# Patient Record
Sex: Male | Born: 1985 | Hispanic: Yes | Marital: Married | Smoking: Former smoker
Health system: Southern US, Community
[De-identification: ages and names within clinical notes are randomized; demographics above are authoritative.]

## PROBLEM LIST (undated history)

## (undated) DIAGNOSIS — J302 Other seasonal allergic rhinitis: Secondary | ICD-10-CM

## (undated) DIAGNOSIS — T7840XA Allergy, unspecified, initial encounter: Secondary | ICD-10-CM

## (undated) DIAGNOSIS — A048 Other specified bacterial intestinal infections: Secondary | ICD-10-CM

## (undated) DIAGNOSIS — K219 Gastro-esophageal reflux disease without esophagitis: Secondary | ICD-10-CM

## (undated) HISTORY — DX: Other specified bacterial intestinal infections: A04.8

## (undated) HISTORY — DX: Allergy, unspecified, initial encounter: T78.40XA

## (undated) HISTORY — DX: Gastro-esophageal reflux disease without esophagitis: K21.9

## (undated) HISTORY — DX: Other seasonal allergic rhinitis: J30.2

## (undated) HISTORY — PX: OTHER SURGICAL HISTORY: SHX169

---

## 2008-06-20 ENCOUNTER — Emergency Department (HOSPITAL_COMMUNITY): Admission: EM | Admit: 2008-06-20 | Discharge: 2008-06-20 | Payer: Self-pay | Admitting: Emergency Medicine

## 2009-06-19 ENCOUNTER — Emergency Department (HOSPITAL_COMMUNITY): Admission: EM | Admit: 2009-06-19 | Discharge: 2009-06-19 | Payer: Self-pay | Admitting: Emergency Medicine

## 2011-04-03 LAB — BASIC METABOLIC PANEL
BUN: 9 mg/dL (ref 6–23)
CO2: 29 mEq/L (ref 19–32)
Calcium: 9.3 mg/dL (ref 8.4–10.5)
Chloride: 107 mEq/L (ref 96–112)
Creatinine, Ser: 0.9 mg/dL (ref 0.4–1.5)
GFR calc Af Amer: >60 mL/min (ref >60)

## 2011-04-03 LAB — CBC
MCHC: 33.3 g/dL (ref 30.0–36.0)
MCV: 91.4 fL (ref 78.0–100.0)
Platelets: 184 10*3/uL (ref 150–400)
RDW: 12.9 % (ref 11.5–15.5)

## 2011-04-03 LAB — URINALYSIS, ROUTINE W REFLEX MICROSCOPIC
Glucose, UA: NEGATIVE mg/dL
Hgb urine dipstick: NEGATIVE
Ketones, ur: NEGATIVE mg/dL
Protein, ur: NEGATIVE mg/dL
Urobilinogen, UA: 0.2 mg/dL (ref 0.0–1.0)

## 2011-09-21 LAB — URINALYSIS, ROUTINE W REFLEX MICROSCOPIC
Glucose, UA: NEGATIVE
Ketones, ur: 40 — AB
Nitrite: NEGATIVE
Protein, ur: NEGATIVE
Urobilinogen, UA: 1

## 2011-09-21 LAB — POCT I-STAT, CHEM 8
BUN: 11
Hemoglobin: 16
Potassium: 3.6
Sodium: 138
TCO2: 24

## 2011-09-21 LAB — DIFFERENTIAL
Basophils Absolute: 0
Eosinophils Relative: 2
Lymphocytes Relative: 26
Neutrophils Relative %: 64

## 2011-09-21 LAB — CBC
HCT: 43.8
Platelets: 168
RDW: 12.8
WBC: 10.5

## 2012-04-08 ENCOUNTER — Emergency Department (INDEPENDENT_AMBULATORY_CARE_PROVIDER_SITE_OTHER)
Admission: EM | Admit: 2012-04-08 | Discharge: 2012-04-08 | Disposition: A | Discharge status: Home / Self Care | Payer: Self-pay | Source: Home / Self Care | Attending: Emergency Medicine | Admitting: Emergency Medicine

## 2012-04-08 ENCOUNTER — Encounter (HOSPITAL_COMMUNITY): Payer: Self-pay

## 2012-04-08 DIAGNOSIS — J019 Acute sinusitis, unspecified: Secondary | ICD-10-CM

## 2012-04-08 DIAGNOSIS — J309 Allergic rhinitis, unspecified: Secondary | ICD-10-CM

## 2012-04-08 DIAGNOSIS — K118 Other diseases of salivary glands: Secondary | ICD-10-CM

## 2012-04-08 MED ORDER — FLUTICASONE PROPIONATE 50 MCG/ACT NA SUSP: 2.0000 | Freq: Every day | NASAL | Status: DC

## 2012-04-08 MED ORDER — AMOXICILLIN 500 MG PO CAPS: 1000.0000 mg | ORAL_CAPSULE | Freq: Three times a day (TID) | ORAL | Status: AC

## 2012-04-08 MED ORDER — METHYLPREDNISOLONE ACETATE 80 MG/ML IJ SUSP
80.0000 mg | Freq: Once | INTRAMUSCULAR | Status: AC
  Administered 2012-04-08: 80 mg via INTRAMUSCULAR

## 2012-04-08 MED ORDER — PREDNISONE 5 MG PO KIT: 1.0000 | PACK | Freq: Every day | ORAL | Status: DC

## 2012-04-08 MED ORDER — MONTELUKAST SODIUM 10 MG PO TABS: 10.0000 mg | ORAL_TABLET | Freq: Every day | ORAL | Status: DC

## 2012-04-08 MED ORDER — METHYLPREDNISOLONE ACETATE 80 MG/ML IJ SUSP
INTRAMUSCULAR | Status: AC
  Filled 2012-04-08: qty 1

## 2012-04-08 NOTE — ED Notes (Signed)
C/o pain facial area w nasal drainage, nasal congestion (green and yellow secretions) for 9+ months , pain in upper sinus area, eyes; NAD at present

## 2012-04-08 NOTE — ED Provider Notes (Signed)
Chief Complaint  Patient presents with  . Sinusitis    History of Present Illness:   The patient is a 26 year old male who states him and wished, but the history was obtained with the help of a telephone interpreter. He relates that 8-9 month history of nasal congestion with clear drainage, pressure and pain in his maxillary areas, headache, sneezing, itchy nose, and itchy, watery eyes. He notes diminished hearing in his left ear. He also has a bump just in front of his left ear which has been present for the past one to 2 years and has not been enlarging in size. This is not tender. He is taking over-the-counter allergy meds for this without much relief. He also uses daily nasal spray. He's had a slight cough but no wheezing or shortness of breath. He denies any fever, chills, or sore throat.  Review of Systems:  Other than noted above, the patient denies any of the following symptoms. Systemic:  No fever, chills, sweats, fatigue, myalgias, headache, or anorexia. Eye:  No redness, pain or drainage. ENT:  No earache, nasal congestion, rhinorrhea, sinus pressure, or sore throat. Lungs:  No cough, sputum production, wheezing, shortness of breath. Or chest pain. GI:  No nausea, vomiting, abdominal pain or diarrhea. Skin:  No rash or itching.  PMFSH:  Past medical history, family history, social history, meds, and allergies were reviewed.  Physical Exam:   Vital signs:  BP 95/60  Pulse 78  Temp(Src) 98.1 F (36.7 C) (Oral)  Resp 16  SpO2 99% General:  Alert, in no distress. Eye:  No conjunctival injection or drainage. ENT:  TMs and canals were normal, without erythema or inflammation.  Nasal mucosa was congested, pale, and boggy with some yellowish drainage on the left.  Mucous membranes were moist.  Pharynx was clear, without exudate or drainage.  There were no oral ulcerations or lesions. he also has a 1 cm nodule just in front of his left ear which was nontender. This was freely movable. It  may be a lymph node or could possibly be a parotid gland tumor. Neck:  Supple, no adenopathy, tenderness or mass. Lungs:  No respiratory distress.  Lungs were clear to auscultation, without wheezes, rales or rhonchi.  Breath sounds were clear and equal bilaterally. Heart:  Regular rhythm, without gallops, murmers or rubs. Skin:  Clear, warm, and dry, without rash or lesions.  Assessment:  The primary encounter diagnosis was Allergic rhinitis. Diagnoses of Acute sinusitis and Parotid nodule were also pertinent to this visit. He needs followup both with an allergist and with an ENT doctor for the parotid nodule.   Plan:   1.  The following meds were prescribed:   New Prescriptions   AMOXICILLIN (AMOXIL) 500 MG CAPSULE    Take 2 capsules (1,000 mg total) by mouth 3 (three) times daily.   FLUTICASONE (FLONASE) 50 MCG/ACT NASAL SPRAY    Place 2 sprays into the nose daily.   MONTELUKAST (SINGULAIR) 10 MG TABLET    Take 1 tablet (10 mg total) by mouth at bedtime.   PREDNISONE 5 MG KIT    Take 1 kit (5 mg total) by mouth daily after breakfast. Prednisone 5 mg 6 day dosepack.  Take as directed.   2.  The patient was instructed in symptomatic care and handouts were given. 3.  The patient was told to return if becoming worse in any way, if no better in 3 or 4 days, and given some red flag symptoms that would  indicate earlier return.  Follow up:  The patient was told to follow up with Dr. Lazarus Salines for the parotid nodule, and with Dr. [place] Callas for the allergy symptoms if the medications were not helping.  The patient was told that the differential diagnosis includes cancer, and that it was of the utmost importance to followup with the specialist to whom he was referred.   Reuben Likes, MD 04/08/12 2221

## 2012-04-08 NOTE — Discharge Instructions (Signed)
Rinitis Alrgica (Allergic Rhinitis) La rinitis alrgica aparece cuando las membranas mucosas de la nariz reaccionan a los alrgenos. Los alrgenos son las partculas que estn en el aire y a las que el organismo responde cuando existe una reaccin alrgica. Esto hace que usted libere anticuerpos de alergia. A travs de una sucesin de procesos, finalmente se libera histamina (de ah el uso de antihistamnicos) en el torrente sanguneo. Aunque esto implica una proteccin para su organismo, es lo que le produce las molestias., como estornudos frecuentes, congestin, picazn y goteos de la nariz.  CAUSAS Los alergenos del polen pueden provenir del csped, rboles y hierbas. Esto produce la rinitis alrgica estacional, o "fiebre de heno". Otras alrgenos pueden ocasionar rinitis alrgica persistente (rinitis alrgica perenne) como aquellos que contienen los caros del polvo del hogar, el pelaje de las mascotas y las esporas del moho.  SNTOMAS  Congestin nasal.   Picazn y goteo de la nariz con estornudos y lagrimeo de los ojos.   Generalmente, tambin puede haber picazn de la boca, ojos y odos.  Las alergias no pueden curarse pero pueden controlarse con medicamentos. DIAGNSTICO Si no reconoce exactamente cul es el alrgeno que le ocasiona el problema, podrn realizarle pruebas de sangre, o de piel para determinarlo. TRATAMIENTO  Evite el alrgeno.   Podrn ser tiles medicamentos y vacunas para la alergia (inmunoterapia).   Con frecuencia la fiebre de heno se trata simplemente con antihistamnicos en forma de pldoras o sprays nasales. Los antihistamnicos bloquean los efectos de la histamina. Existen medicamentos de venta libre que lo ayudarn a aliviar la picazn, la congestin nasal y la hinchazn alrededor de los ojos. Consulte con el profesional antes de tomar o administrar estos medicamentos.  Si estos medicamentos no le resultan efectivos, existen muchos otros nuevos que el  profesional que lo asiste puede prescribirle. Si las medidas iniciales no son efectivas, podrn utilizarse medicamentos ms fuertes. Las inyecciones desensibilizantes pueden utilizarse si los otros medicamentos fracasan. La desensibilizacin aparece cuando un paciente recibe inyecciones continuas hasta que el cuerpo se vuelve menos sensible al alrgeno. Asegrese de realizar un seguimiento con el profesional que lo asiste si los problemas continan. SOLICITE ANTENCIN MDICA SI:   Le sube la temperatura a ms de 100.5 F (38.1 C).   Presenta tos que no se alivia (persistente).   Le falta el aire.   Comienza a respirar con dificultad.   Los sntomas interfieren con las actividades diarias.  Document Released: 09/20/2005 Document Revised: 11/30/2011 [employer] Patient Information 2012 [employer]. 

## 2012-12-20 ENCOUNTER — Encounter: Payer: Self-pay | Admitting: Family Medicine

## 2012-12-20 ENCOUNTER — Ambulatory Visit (INDEPENDENT_AMBULATORY_CARE_PROVIDER_SITE_OTHER): Payer: Self-pay | Admitting: Family Medicine

## 2012-12-20 VITALS — BP 114/71 | HR 66 | Ht 67.0 in | Wt 172.0 lb

## 2012-12-20 DIAGNOSIS — J309 Allergic rhinitis, unspecified: Secondary | ICD-10-CM

## 2012-12-20 DIAGNOSIS — R002 Palpitations: Secondary | ICD-10-CM | POA: Insufficient documentation

## 2012-12-20 DIAGNOSIS — J302 Other seasonal allergic rhinitis: Secondary | ICD-10-CM

## 2012-12-20 MED ORDER — FLUTICASONE PROPIONATE 50 MCG/ACT NA SUSP: 2.0000 | Freq: Every day | NASAL | Status: DC

## 2012-12-20 NOTE — Progress Notes (Signed)
Interpreter Treniyah Lynn Namihira for Dr Cook 

## 2012-12-20 NOTE — Patient Instructions (Addendum)
Fue bueno verte hoy.Por favor disminuirlo lentamente la cafena, ya que est contribuyendo a su ritmo cardiaco rpido.Si contina teniendo problemas, por favor regrese a Glass blower/designer. Una vez que usted tiene un seguro que podemos considerar ms trabajar para arriba si usted sigue teniendo problemas.Tambin he rellenado su flonase.

## 2012-12-20 NOTE — Assessment & Plan Note (Signed)
Refilled Flonase today 

## 2012-12-20 NOTE — Progress Notes (Signed)
Subjective:     Patient ID: Albert Welch, male   DOB: 1986/08/26, 26 y.o.   MRN: [id number]  HPI 26 year old gentlemen presents to clinic to establish care.   Recently, he has been experiencing heart palpitations.  Interpreter present.  1) Heart Palpitations - Patient states that he feels likely his heart is constantly racing.  However, he does not feel like his heart is racing currently. - No associated chest pain or SOB. 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 - He reports drinking lots of Coca-Cola and has also been drinking energy drinks  Review of Systems See HPI    Objective:   Physical Exam  Constitutional: He appears well-developed and well-nourished. No distress.  Cardiovascular: Normal rate, regular rhythm and intact distal pulses.  Exam reveals no gallop and no friction rub.   No murmur heard. Pulmonary/Chest: Effort normal and breath sounds normal. He has no wheezes. He has no rales. He exhibits no tenderness.  Musculoskeletal: He exhibits no edema.      Assessment:         Plan:

## 2012-12-20 NOTE — Assessment & Plan Note (Signed)
Likely secondary to large amounts of caffeine.  Patient advised to taper off caffeine (soft drinks). Given clinical picture and lack of insurance, I did not get EKG or lab work today.  If it continues to persist will get EKG, BMP and consider referral to cardiology for Holter monitor.

## 2013-01-03 ENCOUNTER — Telehealth: Payer: Self-pay | Admitting: *Deleted

## 2013-01-03 NOTE — Telephone Encounter (Signed)
Patient may take Zyrtec 1 tablet daily and use Nettipot for symptomatic relief.  Both can be obtained OTC.

## 2013-01-03 NOTE — Telephone Encounter (Signed)
Patient calls and  Albert Welch interpreter  spoke with him. He states Flonase is not helping. Continues to have nasal stuffiness. Will forward message to Dr. Adriana Simas.

## 2013-01-29 ENCOUNTER — Emergency Department (INDEPENDENT_AMBULATORY_CARE_PROVIDER_SITE_OTHER)
Admission: EM | Admit: 2013-01-29 | Discharge: 2013-01-29 | Disposition: A | Discharge status: Home / Self Care | Payer: Self-pay | Source: Home / Self Care | Attending: Emergency Medicine | Admitting: Emergency Medicine

## 2013-01-29 ENCOUNTER — Emergency Department (INDEPENDENT_AMBULATORY_CARE_PROVIDER_SITE_OTHER): Payer: Self-pay

## 2013-01-29 ENCOUNTER — Encounter (HOSPITAL_COMMUNITY): Payer: Self-pay | Admitting: *Deleted

## 2013-01-29 DIAGNOSIS — J45909 Unspecified asthma, uncomplicated: Secondary | ICD-10-CM

## 2013-01-29 MED ORDER — CETIRIZINE HCL 10 MG PO CAPS: 1.0000 | ORAL_CAPSULE | Freq: Every day | ORAL | Status: DC

## 2013-01-29 MED ORDER — ALBUTEROL SULFATE (5 MG/ML) 0.5% IN NEBU
5.0000 mg | INHALATION_SOLUTION | Freq: Once | RESPIRATORY_TRACT | Status: AC
  Administered 2013-01-29: 5 mg via RESPIRATORY_TRACT

## 2013-01-29 MED ORDER — ALBUTEROL SULFATE (5 MG/ML) 0.5% IN NEBU
INHALATION_SOLUTION | RESPIRATORY_TRACT | Status: AC
  Filled 2013-01-29: qty 1

## 2013-01-29 MED ORDER — ALBUTEROL SULFATE HFA 108 (90 BASE) MCG/ACT IN AERS: 1.0000 | INHALATION_SPRAY | Freq: Four times a day (QID) | RESPIRATORY_TRACT | Status: DC | PRN

## 2013-01-29 MED ORDER — PREDNISONE 10 MG PO TABS: 20.0000 mg | ORAL_TABLET | Freq: Every day | ORAL | Status: AC

## 2013-01-29 MED ORDER — ALBUTEROL SULFATE HFA 108 (90 BASE) MCG/ACT IN AERS: 1.0000 | INHALATION_SPRAY | Freq: Once | RESPIRATORY_TRACT | Status: DC

## 2013-01-29 NOTE — ED Provider Notes (Addendum)
CSN: [id number]  Arrival date & time 01/29/13  1423   First MD Initiated Contact with Patient 01/29/13 1424      Chief Complaint  Patient presents with  . Cough    (Consider location/radiation/quality/duration/timing/severity/associated sxs/prior treatment) HPI Comments: Patient presents urgent care, describing that for a few months at times he feels tightness and some wheezing and cough. 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 He last 2 weeks patient has perceive this tightness wheezing or shortness of breath more frequently to the point that he feels his not breathing very well. Denies any fevers congestion and body aches or headaches.  Patient is a 27 y.o. male presenting with cough. The history is provided by the patient.  Cough This is a new problem. The current episode started more than 1 week ago. The problem occurs constantly. The problem has been gradually worsening. The cough is non-productive. There has been no fever. Associated symptoms include shortness of breath. Pertinent negatives include no chest pain, no chills, no ear pain, no headaches, no rhinorrhea, no sore throat, no myalgias and no wheezing. The treatment provided no relief. His past medical history does not include bronchitis, emphysema or asthma.    History reviewed. No pertinent past medical history.  History reviewed. No pertinent past surgical history.  No family history on file.  History  Substance Use Topics  . Smoking status: Former Games developer  . Smokeless tobacco: Not on file  . Alcohol Use: Yes      Review of Systems  Constitutional: Negative for fever, chills, diaphoresis, activity change and fatigue.  HENT: Negative for ear pain, sore throat and rhinorrhea.   Respiratory: Positive for cough and shortness of breath. Negative for choking, chest tightness, wheezing and stridor.   Cardiovascular: Negative for chest pain.  Musculoskeletal: Negative for myalgias.  Neurological:  Negative for dizziness and headaches.    Allergies  Review of patient's allergies indicates no known allergies.  Home Medications   Current Outpatient Rx  Name  Route  Sig  Dispense  Refill  . ALBUTEROL SULFATE HFA 108 (90 BASE) MCG/ACT IN AERS   Inhalation   Inhale 1-2 puffs into the lungs every 6 (six) hours as needed for wheezing.   1 Inhaler   0   . CETIRIZINE HCL 10 MG PO CAPS   Oral   Take 1 capsule (10 mg total) by mouth daily. X 2 weeks   14 capsule   1   . PREDNISONE 10 MG PO TABS   Oral   Take 2 tablets (20 mg total) by mouth daily.   15 tablet   0     BP 139/81  Pulse 76  Temp 98.6 F (37 C)  Resp 18  SpO2 98%  Physical Exam  Nursing note and vitals reviewed. Constitutional: Vital signs are normal. He appears well-developed and well-nourished.  Non-toxic appearance. He does not have a sickly appearance. He does not appear ill. No distress.  HENT:  Head: Normocephalic.  Eyes: Conjunctivae normal are normal. No scleral icterus.  Neck: No JVD present.  Cardiovascular: Normal rate.  Exam reveals no gallop and no friction rub.   No murmur heard. Pulmonary/Chest: Effort normal. No respiratory distress. He has decreased breath sounds. He has wheezes. He has no rhonchi. He has no rales. He exhibits no tenderness.  Abdominal: Soft.  Lymphadenopathy:    He has no cervical adenopathy.  Skin: No rash noted. No erythema.    ED  Course  Procedures (including critical care time)  Labs Reviewed - No data to display Dg Chest 2 View  01/29/2013  *RADIOLOGY REPORT*  Clinical Data: Cough, congestion, shortness of breath  CHEST - 2 VIEW  Comparison: None.  Findings: No pneumonia or effusion is seen.  There is some peribronchial thickening which may indicate bronchitis. Mediastinal contours appear normal.  The heart is within normal limits in size.  No bony abnormality is seen.  IMPRESSION: No pneumonia.  Question bronchitis.   Original Report Authenticated By: Dwyane Dee, M.D.      1. Reactive airway disease with wheezing     Significant clinical improvement after albuterol nebulizer treatment.  MDM  Symptoms exam was most consistent with, and reactive airway disease most likely induced by chemicals. Her patient will be prescribed Wellbutrin on a course of prednisone and trust to Zyrtec for 2 weeks.   Jimmie Molly, MD 01/29/13 1714  Jimmie Molly, MD 01/29/13 [phone]

## 2013-01-29 NOTE — ED Notes (Signed)
Pt  Reports   Cough         And  Congested           Pt  Is  A  Albert Welch         And  Reports  Some tightness  In  His  Chest            He  Is  A  Former  Smoker             He  Is  Sitting upright on  Exam table  Perhaps  In  Mild  Distress

## 2013-03-28 ENCOUNTER — Encounter: Payer: Self-pay | Admitting: Family Medicine

## 2013-03-28 ENCOUNTER — Ambulatory Visit (HOSPITAL_COMMUNITY)
Admission: RE | Admit: 2013-03-28 | Discharge: 2013-03-28 | Disposition: A | Discharge status: Home / Self Care | Payer: Self-pay | Source: Ambulatory Visit | Attending: Family Medicine | Admitting: Family Medicine

## 2013-03-28 ENCOUNTER — Ambulatory Visit (INDEPENDENT_AMBULATORY_CARE_PROVIDER_SITE_OTHER): Payer: Self-pay | Admitting: Family Medicine

## 2013-03-28 VITALS — BP 119/69 | HR 66 | Wt 167.0 lb

## 2013-03-28 DIAGNOSIS — R079 Chest pain, unspecified: Secondary | ICD-10-CM | POA: Insufficient documentation

## 2013-03-28 MED ORDER — CITALOPRAM HYDROBROMIDE 20 MG PO TABS: 20.0000 mg | ORAL_TABLET | Freq: Every day | ORAL | Status: DC

## 2013-03-28 MED ORDER — OMEPRAZOLE 20 MG PO CPDR: 40.0000 mg | DELAYED_RELEASE_CAPSULE | Freq: Every day | ORAL | Status: DC

## 2013-03-28 NOTE — Assessment & Plan Note (Signed)
Given persistent symptoms, EKG obtained today.  I personally reviewed the EKG - NSR with no ST or T wave changes suggestive of ischemia. Unclear etiology of chest pain.  Does not appear cardiac in origin given age, lack of risk factors, and symptoms reported. Lungs were clear and patient has no history of pulmonary disease, so unlikely Asthma or COPD.  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  However, given reports of anxiousness/nervousness and preoccupation with symptoms this is likely secondary to anxiety. Additional, there could be a component of GERD or esophageal spasm given report of "air" from belly causing pain.  Will treat empirically for likely anxiety and GERD components and reassess at follow up in 4-6 weeks.

## 2013-03-28 NOTE — Patient Instructions (Addendum)
Es Tacy Dura todo probable de [place] y / o reflujo gastroesofgico Su dolor en el pecho. He prescrito 2 medicamentos para usted - uno para el [address] y otro para la ansiedad. Usted puede seguir ejerciendo como lo hara normalmente. Me gustara verte de nuevo en 4-6 semanas (el medicamento ansiedad puede tomar hasta 6 semanas para trabajar).   Ansiedad y crisis de [place] (Anxiety and Panic Attacks) El profesional que lo asiste le ha informado que usted padece ansiedad o crisis de [place]. Este trastorno puede presentarse de [employer]. La mayor parte de las veces las crisis aparecen de modo repentino y sin aviso. Se producen en cualquier momento del da, incluso durante el sueo, y en cualquier etapa de la vida. Pueden ser muy intensas e inexplicadas. Aunque un ataque de pnico puede ser muy atemorizante, no produce daos fsicos. Algunas veces se desconoce la causa de la ansiedad. La ansiedad es un mecanismo protector del organismo en su respuesta de lucha o escape. Muchas de estas situaciones de percepcin de peligro son en realidad situaciones no fsicas (como la ansiedad de perder el [place]). CAUSAS Las causas de la ansiedad o de un ataque de pnico pueden ser [place]. Los ataques de pnico pueden ocurrir en personas sanas en una serie de circunstancias. Es posible que haya una causa gentica de los ataques de pnico. Algunos medicamentos pueden provocar ansiedad como efecto secundario. SNTOMAS Algunas de las sensaciones ms comunes son:  Terror intenso.  Vahdos, desfallecimiento.  Golpes de fro y Airline pilot.  Temor a Estate manager/land agent.  Sentimiento de irrealidad.  Sudoracin.  Temblores.  Dolor en el pecho y latidos irregulares (palpitaciones).  Sensaciones de [employer] o sofocos.  Sentimiento de peligro inminente y de que la muerte est prxima.  Hormigueo en las extremidades que puede venir de la respiracin agitada.  Alteracin de la realidad (desrealizacin).  Sentirse separado de  uno mismo (despersonalizacin). Estos son los sntomas (problemas) ms comunes y pueden combinarse para presentar la crisis de [place].  DIAGNSTICO La evaluacin que realice el profesional depender del tipo de sntomas que est experimentando. El diagnstico de la ansiedad o de ataque de pnico se realiza cuando no se encuentra ninguna enfermedad fsica que pueda determinarse como la causa de los sntomas. TRATAMIENTO El tratamiento para prevenir la ansiedad y los ataques de pnico incluye:  Automotive engineer las circunstancias que causen ansiedad.  Reaseguro y relajacin.  Ejercicio regular.  Terapias de relajacin, como yoga.  Psicoterapia con un psiquiatra o terapeuta.  Evitar la cafena, el alcohol y las drogas [address]  Medicamentos de prescripcin. SOLICITE ATENCIN MDICA DE INMEDIATO SI:  Usted experimenta sntomas de ataque de pnico que son distintos a sus sntomas usuales.  Tiene cualquier sntoma que empeora o lo preocupa. Document Released: 12/11/2005 Document Revised: 03/04/2012 [facility] Patient Information 2013 [place], [place].

## 2013-03-28 NOTE — Progress Notes (Signed)
Subjective:     Patient ID: Albert Welch, male   DOB: Feb 14, 1986, 27 y.o.   MRN: [id number]  HPI Albert Welch presents today for follow up regarding chest pain. Phone interpreter used as patient prefers to speak spanish.  1) Chest pain - Patient seen previously with chest pain and palpitations.  Endorsed large amount of caffeine use and was told to cut back. - He continues to have chest discomfort. He describes it as a tightness.  Located in mid sternum. No associated SOB.  No radiation.  Not associated with exertion and comes on intermittently.  This has improved with cessation of Coca-Cola. - His description is difficult to understand.  He refers to it as "air" from his abdomen that goes to his chest.  - He states that he does not feel that this is from his heart.  He reports being given Albuterol in the past which has helped.   - Additionally, he reports that the sensation makes him anxious and nervous which exacerbates the problem.  Review of Systems Per HPI with the following additions - No reflex or heartburn reported.      Objective:   Physical Exam Filed Vitals:   03/28/13 1539  BP: 119/69  Pulse: 66    General: well appearing gentlemen in NAD. Heart: RRR. No murmurs, rubs, or gallops. Lungs: CTAB. Extremities: no edema appreciated.     Assessment:      Plan:

## 2013-05-22 ENCOUNTER — Encounter: Payer: Self-pay | Admitting: Family Medicine

## 2013-05-22 ENCOUNTER — Ambulatory Visit (INDEPENDENT_AMBULATORY_CARE_PROVIDER_SITE_OTHER): Payer: Self-pay | Admitting: Family Medicine

## 2013-05-22 VITALS — BP 102/53 | HR 60 | Temp 98.8°F | Wt 170.5 lb

## 2013-05-22 DIAGNOSIS — F411 Generalized anxiety disorder: Secondary | ICD-10-CM | POA: Insufficient documentation

## 2013-05-22 DIAGNOSIS — K5900 Constipation, unspecified: Secondary | ICD-10-CM | POA: Insufficient documentation

## 2013-05-22 DIAGNOSIS — K219 Gastro-esophageal reflux disease without esophagitis: Secondary | ICD-10-CM | POA: Insufficient documentation

## 2013-05-22 MED ORDER — POLYETHYLENE GLYCOL 3350 17 GM/SCOOP PO POWD: 17.0000 g | Freq: Every day | ORAL | Status: DC

## 2013-05-22 MED ORDER — OMEPRAZOLE 20 MG PO CPDR: 40.0000 mg | DELAYED_RELEASE_CAPSULE | Freq: Every day | ORAL | Status: DC

## 2013-05-22 MED ORDER — CITALOPRAM HYDROBROMIDE 20 MG PO TABS: 40.0000 mg | ORAL_TABLET | Freq: Every day | ORAL | Status: DC

## 2013-05-22 MED ORDER — FLUTICASONE PROPIONATE 50 MCG/ACT NA SUSP: 2.0000 | Freq: Every day | NASAL | Status: DC

## 2013-05-22 NOTE — Assessment & Plan Note (Signed)
Patient endorsed constipation during history today. Will treat with Miralax and reassess at follow up visit.

## 2013-05-22 NOTE — Assessment & Plan Note (Signed)
Patient's symptomatolgy appears secondary to anxiety (GAD vs. Panic disorder).  He has noted improvement with Celexa, but symptoms are still present.  Will increase Celexa to 40 mg and see patient back in ~6 weeks.   Patient may benefit/need additional anxiolytic if symptoms continue to persist. Additionally, if symptoms continue to persist will screen for co-existing depression.

## 2013-05-22 NOTE — Patient Instructions (Addendum)
Por favor tmese el Celexa 40 mg / da [employer] comprimidos al da). Usted puede tomar esta noche.   Por favor tome el omeprazol 40 mg / da [employer] comprimidos al da).   Utilice la Miralax da para el estreimiento. Es posible que se [place] con ms frecuencia si es necesario para Personnel officer una evacuacin intestinal normal, [employer].

## 2013-05-22 NOTE — Assessment & Plan Note (Signed)
Patient endorses frequent heartburn and reports improvement with Omeprazole. Will continue current therapy.

## 2013-05-22 NOTE — Progress Notes (Signed)
Subjective:     Patient ID: Albert Welch, male   DOB: 1986-11-25, 27 y.o.   MRN: [id number]  HPI Mr. Clabo presents today for follow up regarding chest pain/anxiety.  Interpreter present.  1) Chest pain/anxiety - Chest pain now resolved. - He reports improvement in his symptoms with Celexa and omeprazole. - Patient continues to have intermittent episodes where he feels like he is "going to die."  He describes these episodes as "squeezing a pipe" but denies any associated SOB.  He believes that the underlying cause is related to his stomach as he has noticed improvement with Omeprazole.  - Of note, he has difficult time describing his symptoms.  The interpreter had difficulty understanding his story as well.    Review of Systems Per HPI.  No SOB, chest pain.  He reports heartburn and constipation.  He denies any current stressors in his life.     Objective:   Physical Exam Filed Vitals:   05/22/13 1614  BP: 102/53  Pulse: 60  Temp: 98.8 F (37.1 C)   General: well appearing, NAD. Heart: RRR. No murmurs, rubs, or gallops. Lungs: CTAB. No rales, rhonchi, or wheezing. Abd: soft, nontender, nondistended. No organomegaly. Psych: normal mood and affect.       Assessment:     See Problem list    Plan:

## 2013-11-28 ENCOUNTER — Emergency Department (INDEPENDENT_AMBULATORY_CARE_PROVIDER_SITE_OTHER)
Admission: EM | Admit: 2013-11-28 | Discharge: 2013-11-28 | Disposition: A | Discharge status: Home / Self Care | Payer: Self-pay | Source: Home / Self Care | Attending: Emergency Medicine | Admitting: Emergency Medicine

## 2013-11-28 ENCOUNTER — Encounter (HOSPITAL_COMMUNITY): Payer: Self-pay | Admitting: Emergency Medicine

## 2013-11-28 ENCOUNTER — Emergency Department (INDEPENDENT_AMBULATORY_CARE_PROVIDER_SITE_OTHER): Payer: Self-pay

## 2013-11-28 DIAGNOSIS — K5900 Constipation, unspecified: Secondary | ICD-10-CM

## 2013-11-28 MED ORDER — POLYETHYLENE GLYCOL 3350 17 GM/SCOOP PO POWD: 17.0000 g | Freq: Every day | ORAL | Status: DC

## 2013-11-28 NOTE — ED Notes (Signed)
Discussed d/c instructions w patient, advised to go to ED if syx are not improved after using Rx

## 2013-11-28 NOTE — ED Provider Notes (Signed)
CSN: [id number]     Arrival date & time 11/28/13  1918 History   First MD Initiated Contact with Patient 11/28/13 2013     No chief complaint on file.  (Consider location/radiation/quality/duration/timing/severity/associated sxs/prior Treatment) Patient is a 27 y.o. male presenting with abdominal pain. No language interpreter was used.  Abdominal Pain This is a new problem. Episode onset: 5 days. The problem occurs constantly. Associated symptoms include abdominal pain. Nothing aggravates the symptoms. Nothing relieves the symptoms. He has tried nothing for the symptoms.   Pt reports he has a good appetite but feels like he is swollen.   Pt reports some straining to go to the bathroom and stomach grumbling.  No fever, no vomitting, no diarrhea, Past Medical History  Diagnosis Date  . Seasonal allergies    Past Surgical History  Procedure Laterality Date  . None     Family History  Problem Relation Age of Onset  . Diabetes Mother    History  Substance Use Topics  . Smoking status: Former Smoker    Quit date: 05/25/2010  . Smokeless tobacco: Not on file  . Alcohol Use: No    Review of Systems  Gastrointestinal: Positive for abdominal pain and constipation.  All other systems reviewed and are negative.    Allergies  Review of patient's allergies indicates no known allergies.  Home Medications   Current Outpatient Rx  Name  Route  Sig  Dispense  Refill  . citalopram (CELEXA) 20 MG tablet   Oral   Take 2 tablets (40 mg total) by mouth daily.   60 tablet   3   . fluticasone (FLONASE) 50 MCG/ACT nasal spray   Nasal   Place 2 sprays into the nose daily.   16 g   3   . omeprazole (PRILOSEC) 20 MG capsule   Oral   Take 2 capsules (40 mg total) by mouth daily.   60 capsule   3   . polyethylene glycol powder (GLYCOLAX/MIRALAX) powder   Oral   Take 17 g by mouth daily.   119 g   3    BP 122/73  Pulse 68  Temp(Src) 98.3 F (36.8 C) (Oral)  Resp 14  SpO2  100% Physical Exam  Nursing note and vitals reviewed. Constitutional: He appears well-developed and well-nourished.  HENT:  Head: Normocephalic.  Right Ear: External ear normal.  Left Ear: External ear normal.  Eyes: Pupils are equal, round, and reactive to light.  Neck: Normal range of motion.  Cardiovascular: Normal rate and normal heart sounds.   Pulmonary/Chest: Effort normal and breath sounds normal.  Abdominal: Soft.  Musculoskeletal: Normal range of motion.  Neurological: He is alert.  Skin: Skin is warm.  Psychiatric: He has a normal mood and affect.    ED Course  Procedures (including critical care time) Labs Review Labs Reviewed - No data to display Imaging Review No results found.  EKG Interpretation    Date/Time:    Ventricular Rate:    PR Interval:    QRS Duration:   QT Interval:    QTC Calculation:   R Axis:     Text Interpretation:              MDM   1. Constipation    miralax    Elson Areas, PA-C 11/28/13 2043

## 2013-11-28 NOTE — ED Notes (Signed)
C/o abdominal pain for 5 days.  Patient has a good appetite, but feels pain after eating.  No nausea, no vomiting, no diarrhea.  No urinary symptoms

## 2013-12-01 NOTE — ED Provider Notes (Signed)
Medical screening examination/treatment/procedure(s) were performed by a resident physician or non-physician practitioner and as the supervising physician I was immediately available for consultation/collaboration.  Clementeen Graham, MD    Rodolph Bong, MD 12/01/13 [phone]

## 2014-01-09 ENCOUNTER — Encounter: Payer: Self-pay | Admitting: Gastroenterology

## 2014-01-23 ENCOUNTER — Ambulatory Visit (INDEPENDENT_AMBULATORY_CARE_PROVIDER_SITE_OTHER): Payer: Self-pay | Admitting: Gastroenterology

## 2014-01-23 ENCOUNTER — Encounter: Payer: Self-pay | Admitting: Gastroenterology

## 2014-01-23 VITALS — BP 120/82 | HR 76 | Ht 67.5 in | Wt 166.2 lb

## 2014-01-23 DIAGNOSIS — F411 Generalized anxiety disorder: Secondary | ICD-10-CM

## 2014-01-23 DIAGNOSIS — K219 Gastro-esophageal reflux disease without esophagitis: Secondary | ICD-10-CM

## 2014-01-23 MED ORDER — ALPRAZOLAM 0.5 MG PO TABS: ORAL_TABLET | ORAL | Status: DC

## 2014-01-23 NOTE — Assessment & Plan Note (Signed)
Patient continues to have a soft or reflux despite having taken omeprazole.  Anxiety may be contributing to his symptoms.  He currently is off all medications.  Other considerations include ulcer or nonulcer dyspepsia and H. pylori infection  Recommendations #1 trial of dexilant 60 mg before breakfast #2 upper endoscopy

## 2014-01-23 NOTE — Progress Notes (Signed)
    _                                                                                                                History of Present Illness: History obtained through a Nurse, learning disabilitytranslator.  28 year old Hispanic male referred for evaluation of abdominal pain.  Despite taking PPI therapy he's been suffering from very frequent pyrosis, burning and abdominal discomfort and early satiety.  He is on no regular gastric irritants including nonsteroidals.  He denies dysphagia.  He was suffering from constipation but now is moving his bowels more regularly.  Since stopping smoking he's had what sounds like episodes of anxiety with periods of shortness of breath and nervousness.  There is no history of melena or hematochezia.    Past Medical History  Diagnosis Date  . Seasonal allergies   . GERD (gastroesophageal reflux disease)    Past Surgical History  Procedure Laterality Date  . None     family history includes Diabetes in his mother; Kidney disease in his mother. There is no history of Colon cancer or Esophageal cancer. No current outpatient prescriptions on file.   No current facility-administered medications for this visit.   Allergies as of 01/23/2014  . (No Known Allergies)    reports that he quit smoking about 3 years ago. He has never used smokeless tobacco. He reports that he drinks alcohol. He reports that he does not use illicit drugs.     Review of Systems: Pertinent positive and negative review of systems were noted in the above HPI section. All other review of systems were otherwise negative.  Vital signs were reviewed in today's medical record Physical Exam: General: Well developed , well nourished, no acute distress Skin: anicteric Head: Normocephalic and atraumatic Eyes:  sclerae anicteric, EOMI Ears: Normal auditory acuity Mouth: No deformity or lesions Neck: Supple, no masses or thyromegaly Lungs: Clear throughout to auscultation Heart: Regular rate and  rhythm; no murmurs, rubs or bruits Abdomen: Soft, non tender and non distended. No masses, hepatosplenomegaly or hernias noted. Normal Bowel sounds Rectal:deferred Musculoskeletal: Symmetrical with no gross deformities  Skin: No lesions on visible extremities Pulses:  Normal pulses noted Extremities: No clubbing, cyanosis, edema or deformities noted Neurological: Alert oriented x 4, grossly nonfocal Cervical Nodes:  No significant cervical adenopathy Inguinal Nodes: No significant inguinal adenopathy Psychological:  Alert and cooperative. Normal mood and affect  See Assessment and Plan under Problem List

## 2014-01-23 NOTE — Patient Instructions (Signed)
Dieta para el reflujo gastroesofágico - Adultos   (Diet for Gastroesophageal Reflux Disease, Adult)   El reflujo (reflujo ácido) ocurre cuando el ácido del estómago pasa al esófago. Cuando el ácido entra en contacto con el esófago, el ácido provoca dolor e irritación (inflamación) en el esófago. Cuando el reflujo ocurre a menudo o es tan grave que causa daño en el esófago, se denomina enfermedad por reflujo gastroesofágico (ERGE). La terapia nutricional puede ayudar a aliviar el malestar de la ERGE.   ALIMENTOS O BEBIDAS QUE DEBE EVITAR O LIMITAR   · Fumar o consumir tabaco. La nicotina es uno de los estimulantes más potentes en la producción de ácido en el tracto gastrointestinal.  · Café y té negro con cafeína o descafeinado.  · Gaseosas comunes o bajas calorías o bebidas energizantes (las gaseosas sin cafeína están permitidas).    · Especias picantes, como la pimienta negra, pimienta blanca, pimienta roja, pimienta de cayena, curry en polvo,y chile en polvo.  · Menta y mentol.  · Chocolate.  · Alimentos con alto contenido de grasas, incluyendo las carnes y comidas fritas. El agregado de grasas extra, por ejemplo aceite, manteca, aderezo para ensaladas y nueces. Limite estos alimentos a menos de 8 cucharaditas por día.  · Las frutas y verduras si no son toleradas, tales como frutas cítricas o tomates.  · El alcohol.  · Todo alimento que agrave el trastorno.  Si tiene dudas relacionadas con la dieta, comuníquese con el profesional que lo asiste o con un nutricionista matriculado.   OTROS FACTORES QUE PUEDEN ALIVIAR EL ERGE SON:   · Comer lentamente, en un clima distendido.  · Hacer 5 o 6 comidas pequeñas por día en vez de tres grandes.  · Suprimir por un tiempo los alimentos que causen problemas.  · No acostarse hasta después de 3 horas de haber comido.  · Mantener la cabeza elevada 6 a 9 pulgadas (15 a 23 cm) usando una cuña de espuma o bloques debajo de las patas de la cama. Si permanece en una postura plana hará  empeorar los síntomas.  · Manténgase físicamente activo. Perder peso puede ser de ayuda para reducir el reflujo en los adultos obesos o con sobrepeso.  · Use ropas sueltas.  EJEMPLO DE UN PLAN DE ALIMENTACIÓN   Este plan de alimentación consiste en aproximadamente 2 000 calorías, según las guías de alimentación de ChooseMyPlate.gov.   Desayuno  · ½ taza de avena cocida.  · 1 porción de fresas.  · 1 taza de leche descremada.  · 1 oz de almendras.  Colación  · 1 taza de rebanadas de pepino.  · 6 oz de yogur (elaborado con leche con bajo contenido de grasas o descremada).  Almuerzo:  · 2 rebanada de pan integral.  · 2½ oz de rebanadas de pavo.  · 2 cucharaditas de mayonesa.  · 1 taza de arándanos.  · 1 taza de guisantes.  Colación  · 6 crackers integrales.  · 1 oz ( 28 g) de queso en hebras.  Cena  · ½ taza de arroz integral.  · 1 taza de vegetales variados.  · 1 cucharadita de aceite de oliva.  · 3 oz ( 84 g) de pescado grillé.  Document Released: 09/20/2005 Document Revised: 03/04/2012  ExitCare® Patient Information ©2014 [employer], LLC.

## 2014-01-23 NOTE — Assessment & Plan Note (Signed)
Anxiety seems to be an active issue.  He is taking Celexa in the past.  He probably could benefit from a long acting anxiolytic.  I will refer him back to his PCP for this.  In the interim I will prescribe Xanax

## 2014-02-17 ENCOUNTER — Encounter: Payer: Self-pay | Admitting: Gastroenterology

## 2014-02-17 ENCOUNTER — Ambulatory Visit (AMBULATORY_SURGERY_CENTER): Payer: Self-pay | Admitting: Gastroenterology

## 2014-02-17 VITALS — BP 106/74 | HR 65 | Temp 97.8°F | Resp 44 | Wt 166.0 lb

## 2014-02-17 DIAGNOSIS — K2970 Gastritis, unspecified, without bleeding: Secondary | ICD-10-CM

## 2014-02-17 DIAGNOSIS — K219 Gastro-esophageal reflux disease without esophagitis: Secondary | ICD-10-CM

## 2014-02-17 DIAGNOSIS — K2990 Gastroduodenitis, unspecified, without bleeding: Secondary | ICD-10-CM

## 2014-02-17 DIAGNOSIS — A048 Other specified bacterial intestinal infections: Secondary | ICD-10-CM

## 2014-02-17 DIAGNOSIS — K2980 Duodenitis without bleeding: Secondary | ICD-10-CM

## 2014-02-17 MED ORDER — SODIUM CHLORIDE 0.9 % IV SOLN: 500.0000 mL | INTRAVENOUS | Status: DC

## 2014-02-17 NOTE — Progress Notes (Signed)
Called to room to assist during endoscopic procedure.  Patient ID and intended procedure confirmed with present staff. Received instructions for my participation in the procedure from the performing physician.  

## 2014-02-17 NOTE — Op Note (Signed)
 [facility] [address] [place] [id number]   ENDOSCOPY PROCEDURE REPORT  PATIENT: Albert Welch, Albert Welch  MR#: [id number] BIRTHDATE: Mar 16, 1986 , 28  yrs. old GENDER: Male ENDOSCOPIST: Louis Meckelobert D Nyree Applegate, MD REFERRED BY:  Sigmund HazelLisa Miller, M.D. PROCEDURE DATE:  02/17/2014 PROCEDURE:  EGD w/ biopsy ASA CLASS:     Class I INDICATIONS:  Epigastric pain.   Heartburn. MEDICATIONS: MAC sedation, administered by CRNA, propofol (Diprivan) 150mg  IV, and Simethicone 0.6cc PO TOPICAL ANESTHETIC: Cetacaine Spray  DESCRIPTION OF PROCEDURE: After the risks benefits and alternatives of the procedure were thoroughly explained, informed consent was obtained.  The LB [id number] endoscope was introduced through the mouth and advanced to the third portion of the duodenum. Without limitations.  The instrument was slowly withdrawn as the mucosa was fully examined.      In the gastric fundus and cardia, and in the duodenal bulb, there is erosive changes and areas of subepithelial hemorrhage.  Biopsies were taken of the stomach.  There was no fresh or old blood.   The remainder of the upper endoscopy exam was otherwise normal. Retroflexed views revealed no abnormalities.     The scope was then withdrawn from the patient and the procedure completed.  COMPLICATIONS: There were no complications. ENDOSCOPIC IMPRESSION: 1.   gastritis and duodenitis  RECOMMENDATIONS: 1.  Await pathology results 2.  Continue current meds 3.  Call office next 2-3 days to schedule an office appointment for 3-4 weeks  REPEAT EXAM:  eSigned:  Louis Meckelobert D June Rode, MD 02/17/2014 10:21 AM   CC: Everlene OtherJayce Cook, MD

## 2014-02-17 NOTE — Patient Instructions (Addendum)
YOU HAD AN ENDOSCOPIC PROCEDURE TODAY AT THE [facility]: Refer to the procedure report that was given to you for any specific questions about what was found during the examination.  If the procedure report does not answer your questions, please call your gastroenterologist to clarify.  If you requested that your care partner not be given the details of your procedure findings, then the procedure report has been included in a sealed envelope for you to review at your convenience later.  YOU SHOULD EXPECT: Some feelings of bloating in the abdomen. Passage of more gas than usual.  Walking can help get rid of the air that was put into your GI tract during the procedure and reduce the bloating. If you had a lower endoscopy (such as a colonoscopy or flexible sigmoidoscopy) you may notice spotting of blood in your stool or on the toilet paper. If you underwent a bowel prep for your procedure, then you may not have a normal bowel movement for a few days.  DIET: Your first meal following the procedure should be a light meal and then it is ok to progress to your normal diet.  A half-sandwich or bowl of soup is an example of a good first meal.  Heavy or fried foods are harder to digest and may make you feel nauseous or bloated.  Likewise meals heavy in dairy and vegetables can cause extra gas to form and this can also increase the bloating.  Drink plenty of fluids but you should avoid alcoholic beverages for 24 hours.  Do not have anything by mouth until the numbness is gone.  I don't want you to choke!  ACTIVITY: Your care partner should take you home directly after the procedure.  You should plan to take it easy, moving slowly for the rest of the day.  You can resume normal activity the day after the procedure however you should NOT DRIVE or use heavy machinery for 24 hours (because of the sedation medicines used during the test).    SYMPTOMS TO REPORT IMMEDIATELY: A gastroenterologist can be reached  at any hour.  During normal business hours, 8:30 AM to 5:00 PM Monday through Friday, call [phone].  After hours and on weekends, please call the GI answering service at [phone] who will take a message and have the physician on call contact you.   Following upper endoscopy (EGD)  Vomiting of blood or coffee ground material  New chest pain or pain under the shoulder blades  Painful or persistently difficult swallowing  New shortness of breath  Fever of 100F or higher  Black, tarry-looking stools  FOLLOW UP: If any biopsies were taken you will be contacted by phone or by letter within the next 1-3 weeks.  Call your gastroenterologist if you have not heard about the biopsies in 3 weeks.  Our staff will call the home number listed on your records the next business day following your procedure to check on you and address any questions or concerns that you may have at that time regarding the information given to you following your procedure. This is a courtesy call and so if there is no answer at the home number and we have not heard from you through the emergency physician on call, we will assume that you have returned to your regular daily activities without incident.  SIGNATURES/CONFIDENTIALITY: You and/or your care partner have signed paperwork which will be entered into your electronic medical record.  These signatures attest to the fact  that that the information above on your After Visit Summary has been reviewed and is understood.  Full responsibility of the confidentiality of this discharge information lies with you and/or your care-partner.  GERD information given as well as info about Gastritis.

## 2014-02-18 ENCOUNTER — Telehealth: Payer: Self-pay | Admitting: *Deleted

## 2014-02-18 NOTE — Telephone Encounter (Signed)
  Follow up Call-  Call back number 02/17/2014  Post procedure Call Back phone  # [phone]  Permission to leave phone message Yes     Patient questions:  Do you have a fever, pain , or abdominal swelling? no Pain Score  0 *  Have you tolerated food without any problems? yes  Have you been able to return to your normal activities? yes  Do you have any questions about your discharge instructions: Diet   no Medications  no Follow up visit  no  Do you have questions or concerns about your Care? no  Actions: * If pain score is 4 or above: No action needed, pain <4.

## 2014-02-26 ENCOUNTER — Telehealth: Payer: Self-pay | Admitting: *Deleted

## 2014-02-26 ENCOUNTER — Encounter: Payer: Self-pay | Admitting: Gastroenterology

## 2014-02-26 DIAGNOSIS — R1013 Epigastric pain: Secondary | ICD-10-CM

## 2014-02-26 MED ORDER — AMOXICILL-CLARITHRO-LANSOPRAZ PO MISC: Freq: Two times a day (BID) | ORAL | Status: DC

## 2014-02-26 NOTE — Telephone Encounter (Signed)
Pt states he doesn't speak much  [place].  I explained that [facility] [place], follow those instructions and keep his appt that is set up with Dr. Arlyce DiceKaplan

## 2014-02-26 NOTE — Telephone Encounter (Signed)
Not able to leave a message in voicemail box- not set up yet.  I sent his RX to his pharmacy- [facility], and will try pt again

## 2014-03-13 ENCOUNTER — Encounter: Payer: Self-pay | Admitting: Gastroenterology

## 2014-03-13 ENCOUNTER — Ambulatory Visit (INDEPENDENT_AMBULATORY_CARE_PROVIDER_SITE_OTHER): Payer: Self-pay | Admitting: Gastroenterology

## 2014-03-13 VITALS — BP 110/70 | HR 72 | Ht 67.5 in | Wt 161.0 lb

## 2014-03-13 DIAGNOSIS — K219 Gastro-esophageal reflux disease without esophagitis: Secondary | ICD-10-CM

## 2014-03-13 DIAGNOSIS — F411 Generalized anxiety disorder: Secondary | ICD-10-CM

## 2014-03-13 MED ORDER — ALPRAZOLAM 0.5 MG PO TABS: ORAL_TABLET | ORAL | Status: DC

## 2014-03-13 MED ORDER — LANSOPRAZOLE 30 MG PO CPDR: 30.0000 mg | DELAYED_RELEASE_CAPSULE | Freq: Every day | ORAL | Status: DC

## 2014-03-13 NOTE — Assessment & Plan Note (Signed)
Patient has GERD and dyspepsia.  I suspect that anxiety is also a contributing factor.  He was recently treated for H. pylori.    Recommendations #1 restart Prevacid 30 mg daily

## 2014-03-13 NOTE — Patient Instructions (Signed)
We have given you a printed prescription of Xanax today

## 2014-03-13 NOTE — Progress Notes (Signed)
          History of Present Illness:  The patient has returned following upper endoscopy which demonstrated a gastroduodenitis.  He was H. pylori positive and was treated with a Prevpac.  Symptoms resolve but over the past week or so he's having recurrent mild discomfort and a "emptying feeling" similar to what he had previously.  He is on no medications.  Sutures were also better when he took Xanax.    Review of Systems: Pertinent positive and negative review of systems were noted in the above HPI section. All other review of systems were otherwise negative.    Current Medications, Allergies, Past Medical History, Past Surgical History, Family History and Social History were reviewed in [facility] electronic [facility]  Vital signs were reviewed in today's medical record. Physical Exam: General: Well developed , well nourished, no acute distress   See Assessment and Plan under Problem List

## 2014-07-20 ENCOUNTER — Telehealth: Payer: Self-pay | Admitting: Gastroenterology

## 2014-07-20 NOTE — Telephone Encounter (Signed)
Call placed to home. Wife answered but states they will call back later because she does not speak english well.

## 2014-07-30 ENCOUNTER — Ambulatory Visit: Payer: Self-pay | Admitting: Internal Medicine

## 2014-09-28 ENCOUNTER — Emergency Department (HOSPITAL_COMMUNITY)
Admission: EM | Admit: 2014-09-28 | Discharge: 2014-09-28 | Disposition: A | Discharge status: Home / Self Care | Payer: Self-pay | Source: Home / Self Care | Attending: Family Medicine | Admitting: Family Medicine

## 2014-09-28 ENCOUNTER — Encounter (HOSPITAL_COMMUNITY): Payer: Self-pay | Admitting: Emergency Medicine

## 2014-09-28 DIAGNOSIS — H00016 Hordeolum externum left eye, unspecified eyelid: Secondary | ICD-10-CM

## 2014-09-28 MED ORDER — TETRACAINE HCL 0.5 % OP SOLN
OPHTHALMIC | Status: AC
  Filled 2014-09-28: qty 2

## 2014-09-28 MED ORDER — POLYMYXIN B-TRIMETHOPRIM 10000-0.1 UNIT/ML-% OP SOLN: 2.0000 [drp] | OPHTHALMIC | Status: DC

## 2014-09-28 NOTE — ED Provider Notes (Signed)
CSN: [id number]     Arrival date & time 09/28/14  1049 History   First MD Initiated Contact with Patient 09/28/14 1129     Chief Complaint  Patient presents with  . Stye   (Consider location/radiation/quality/duration/timing/severity/associated sxs/prior Treatment) HPI Comments: Left lower eyelid stye x 4 days. Reports some improvement with warm compresses yesterday. No fever/chills or changes in vision. Area is minimally tender. No contact lens use. Otherwise healthy. Denies pain Works as Education administratorpainter.   The history is provided by the patient.    Past Medical History  Diagnosis Date  . Seasonal allergies   . GERD (gastroesophageal reflux disease)   . H. pylori infection    Past Surgical History  Procedure Laterality Date  . None     Family History  Problem Relation Age of Onset  . Diabetes Mother   . Kidney disease Mother     as a child  . Colon cancer Neg Hx   . Esophageal cancer Neg Hx    History  Substance Use Topics  . Smoking status: Former Smoker    Quit date: 05/25/2010  . Smokeless tobacco: Never Used  . Alcohol Use: Yes     Comment: rare    Review of Systems  All other systems reviewed and are negative.   Allergies  Review of patient's allergies indicates no known allergies.  Home Medications   Prior to Admission medications   Medication Sig Start Date End Date Taking? Authorizing Provider  ALPRAZolam Prudy Feeler(XANAX) 0.5 MG tablet Take one tab every morning, then every 8 hours as needed 03/13/14   Louis Meckelobert D Kaplan, MD  lansoprazole (PREVACID) 30 MG capsule Take 1 capsule (30 mg total) by mouth daily at 12 noon. 03/13/14   Louis Meckelobert D Kaplan, MD  trimethoprim-polymyxin b (POLYTRIM) ophthalmic solution Place 2 drops into the left eye every 4 (four) hours. X 7 days 09/28/14   Jess BartersJennifer Lee H Jenne Sellinger, PA   BP 115/61  Pulse 67  Temp(Src) 98.8 F (37.1 C) (Oral)  SpO2 100% Physical Exam  Nursing note and vitals reviewed. Constitutional: He is oriented to person, place,  and time. He appears well-developed and well-nourished. No distress.  HENT:  Head: Normocephalic and atraumatic.  Eyes: Conjunctivae and EOM are normal. Pupils are equal, round, and reactive to light. Right eye exhibits chemosis. Right eye exhibits no discharge, no exudate and no hordeolum. No foreign body present in the right eye. Left eye exhibits hordeolum. Left eye exhibits no chemosis, no discharge and no exudate. No foreign body present in the left eye. No scleral icterus.    Cardiovascular: Normal rate.   Pulmonary/Chest: Effort normal.  Musculoskeletal: Normal range of motion.  Neurological: He is alert and oriented to person, place, and time.  Skin: Skin is warm and dry.  Psychiatric: He has a normal mood and affect. His behavior is normal.    ED Course  Procedures (including critical care time) Labs Review Labs Reviewed - No data to display  Imaging Review No results found.   MDM   1. Stye external, left   Warm compresses QID until healed and polytrim opth drops as prescribed. Follow up if no improvement.     Ria ClockJennifer Lee H Shamara Soza, PA 09/28/14 1226

## 2014-09-28 NOTE — ED Notes (Signed)
Eye pain x 1 week

## 2014-09-28 NOTE — Discharge Instructions (Signed)
Warm compresses to affected area 4 x day until healed Orzuelo (Sty) Se trata de una infeccin en una glndula del prpado, [place]icada en la base de una pestaa. Una orzuelo puede desarrollar un punto de pus blanco o amarillo. Puede inflamarse. Generalmente el orzuelo se abre y el pus comienza a salir espontneamente. Una vez que drenan, no dejan bulto en el prpado. Un orzuelo a menudo se confunde con otra forma de quiste del prpado que se denomina chalazion. El chalazion aparece dentro del prpado y no en el borde en el que se encuentran las bases de las [place]estaas. A menudo son rojizos, duelen y forman bultos en el prpado. CAUSAS  Grmenes (bacterias).  Inflamacin del prpado de larga duracin (crnica). SNTOMAS  Molestias, enrojecimiento e inflamacin en el borde del prpado en la base de las pestaas.  A veces puede desarrollar un punto de pus blanco o amarillo. Puede drenar o no. DIAGNSTICO Un oftalmlogo podr distinguir entre un orzuelo y un chalazin y tratar la enfermedad.  TRATAMIENTO  Los orzuelos normalmente se tratan con compresas calientes [place] que drenen.  En pocos caos, el profesional que lo asiste podr prescribirle medicamentos que destruyen grmenes (antibiticos). Estos antibiticos podrn prescribirse en forma de gotas, cremas o pldoras.  Si se forma un bulto duro, en general ser necesario realizar una pequea incisin y eliminar la parte endurecida del quiste en un procedimiento de ciruga menor que se Electronics engineerrealiza en el consultorio.  En algunos casos, el mdico podr enviar el contenido del quiste al laboratorio para asegurarse de que no es una forma de cncer rara pero peligroso de las glndulas del prpado. INSTRUCCIONES PARA EL CUIDADO DOMICILIARIO  Lave sus manos con frecuencia y squelas con una toalla limpia. Evite tocarse el prpado. Esto puede diseminar la infeccin a otras partes del ojo.  Aplique calor sobre el prpado durante 10 a 20 minutos varias veces  por da para Engineer, materialsaliviar el dolor y ayudar a que se cure ms rpidamente.  No apriete el orzuelo. Permita que drene slo. Lvese el prpado cuidadosamente 3  4 veces por da para retirar el pus. SOLICITE ATENCIN MDICA DE INMEDIATO SI:  Comienza a sentir dolor en el ojo, o se le hincha.  La visin se modifica.  El orzuelo no drena por s mismo en 3 das.  El orzuelo aparece nuevamente despus de un breve perodo, an con tratamiento.  Observa enrojecimiento (inflamacin) alrededor del ojo.  Tiene fiebre. Document Released: 09/20/2005 Document Revised: 03/04/2012 [facility] Patient Information 2015 [place]. This information is not intended to replace advice given to you by your health care provider. Make sure you discuss any questions you have with your health care provider.

## 2014-09-30 NOTE — ED Provider Notes (Signed)
Medical screening examination/treatment/procedure(s) were performed by a resident physician or non-physician practitioner and as the supervising physician I was immediately available for consultation/collaboration.  Paysley Poplar, MD    Alyss Granato S Salome Cozby, MD 09/30/14 0759 

## 2014-11-26 ENCOUNTER — Encounter (HOSPITAL_COMMUNITY): Payer: Self-pay | Admitting: *Deleted

## 2015-01-21 ENCOUNTER — Ambulatory Visit (INDEPENDENT_AMBULATORY_CARE_PROVIDER_SITE_OTHER): Payer: Self-pay | Admitting: Gastroenterology

## 2015-01-21 ENCOUNTER — Encounter: Payer: Self-pay | Admitting: Gastroenterology

## 2015-01-21 ENCOUNTER — Other Ambulatory Visit: Payer: Self-pay

## 2015-01-21 VITALS — BP 118/78 | HR 78 | Ht 67.0 in | Wt 171.6 lb

## 2015-01-21 DIAGNOSIS — K219 Gastro-esophageal reflux disease without esophagitis: Secondary | ICD-10-CM

## 2015-01-21 DIAGNOSIS — R109 Unspecified abdominal pain: Secondary | ICD-10-CM

## 2015-01-21 MED ORDER — HYOSCYAMINE SULFATE ER 0.375 MG PO TBCR
EXTENDED_RELEASE_TABLET | ORAL | Status: DC
Start: 2015-01-21 — End: 2015-01-27

## 2015-01-21 NOTE — Assessment & Plan Note (Signed)
Reflux symptoms are well controlled with PPI therapy.  He has nonspecific upper abdominal discomfort and lower abdominal discomfort.  You have his history of H. pylori I think it is prudent to recheck by obtaining a stool specimen for H. pylori antigen.  Upper and lower abdominal pain are nonspecific and I will prescribe hyomax.

## 2015-01-21 NOTE — Progress Notes (Signed)
      History of Present Illness:  [name] has returned for follow-up of abdominal pain.  He underwent upper endoscopy almost a year ago which demonstrated a gastroduodenitis.  He was treated for an H pylori infection.  At this time he claims pyrosis is significantly improved.  He has occasional discomfort in his upper abdomen that is assuaged by eating.  He may have some spontaneous lower abdominal discomfort as well    Review of Systems: Pertinent positive and negative review of systems were noted in the above HPI section. All other review of systems were otherwise negative.    Current Medications, Allergies, Past Medical History, Past Surgical History, Family History and Social History were reviewed in [facility] electronic medical record  Vital signs were reviewed in today's medical record. Physical Exam: General: Well developed , well nourished, no acute distress   See Assessment and Plan under Problem List

## 2015-01-21 NOTE — Patient Instructions (Signed)
Go to the basement for labs today We will send in your prescription to your pharmacy

## 2015-01-23 LAB — HELICOBACTER PYLORI  SPECIAL ANTIGEN: H. PYLORI Antigen: NEGATIVE

## 2015-01-27 ENCOUNTER — Other Ambulatory Visit: Payer: Self-pay

## 2015-01-27 DIAGNOSIS — K219 Gastro-esophageal reflux disease without esophagitis: Secondary | ICD-10-CM

## 2015-01-27 MED ORDER — HYOSCYAMINE SULFATE ER 0.375 MG PO TBCR: EXTENDED_RELEASE_TABLET | ORAL | Status: DC

## 2015-01-27 MED ORDER — LANSOPRAZOLE 30 MG PO CPDR: 30.0000 mg | DELAYED_RELEASE_CAPSULE | Freq: Every day | ORAL | Status: DC

## 2015-01-27 NOTE — Progress Notes (Signed)
Quick Note:  Please inform the patient that stool H. pylori test was normal and to continue current plan of action ______

## 2016-06-29 ENCOUNTER — Encounter (HOSPITAL_COMMUNITY): Payer: Self-pay | Admitting: Emergency Medicine

## 2016-06-29 ENCOUNTER — Emergency Department (HOSPITAL_COMMUNITY)
Admission: EM | Admit: 2016-06-29 | Discharge: 2016-06-30 | Disposition: A | Discharge status: Home / Self Care | Payer: Self-pay | Attending: Emergency Medicine | Admitting: Emergency Medicine

## 2016-06-29 DIAGNOSIS — R1032 Left lower quadrant pain: Secondary | ICD-10-CM | POA: Insufficient documentation

## 2016-06-29 DIAGNOSIS — Z87891 Personal history of nicotine dependence: Secondary | ICD-10-CM | POA: Insufficient documentation

## 2016-06-29 DIAGNOSIS — K922 Gastrointestinal hemorrhage, unspecified: Secondary | ICD-10-CM | POA: Insufficient documentation

## 2016-06-29 LAB — URINALYSIS, ROUTINE W REFLEX MICROSCOPIC
BILIRUBIN URINE: NEGATIVE
Glucose, UA: NEGATIVE mg/dL
Hgb urine dipstick: NEGATIVE
Ketones, ur: NEGATIVE mg/dL
Leukocytes, UA: NEGATIVE
NITRITE: NEGATIVE
Protein, ur: NEGATIVE mg/dL
SPECIFIC GRAVITY, URINE: 1.009 (ref 1.005–1.030)
pH: 7 (ref 5.0–8.0)

## 2016-06-29 LAB — CBC
HEMATOCRIT: 46.8 % (ref 39.0–52.0)
HEMOGLOBIN: 16.3 g/dL (ref 13.0–17.0)
MCH: 30.4 pg (ref 26.0–34.0)
MCHC: 34.8 g/dL (ref 30.0–36.0)
MCV: 87.3 fL (ref 78.0–100.0)
Platelets: 239 10*3/uL (ref 150–400)
RBC: 5.36 MIL/uL (ref 4.22–5.81)
RDW: 12.6 % (ref 11.5–15.5)
WBC: 8.4 10*3/uL (ref 4.0–10.5)

## 2016-06-29 LAB — COMPREHENSIVE METABOLIC PANEL
ALT: 51 U/L (ref 17–63)
AST: 37 U/L (ref 15–41)
Albumin: 4.6 g/dL (ref 3.5–5.0)
Alkaline Phosphatase: 88 U/L (ref 38–126)
Anion gap: 6 (ref 5–15)
BUN: 8 mg/dL (ref 6–20)
CHLORIDE: 104 mmol/L (ref 101–111)
CO2: 28 mmol/L (ref 22–32)
Calcium: 9.9 mg/dL (ref 8.9–10.3)
Creatinine, Ser: 0.94 mg/dL (ref 0.61–1.24)
Glucose, Bld: 84 mg/dL (ref 65–99)
POTASSIUM: 3.4 mmol/L — AB (ref 3.5–5.1)
SODIUM: 138 mmol/L (ref 135–145)
Total Bilirubin: 0.3 mg/dL (ref 0.3–1.2)
Total Protein: 7.6 g/dL (ref 6.5–8.1)

## 2016-06-29 NOTE — ED Notes (Signed)
Pt. reports bloody stools today with low abdominal pain , denies nausea or vomitting / no diarrhea or fever .

## 2016-06-30 ENCOUNTER — Emergency Department (HOSPITAL_COMMUNITY): Payer: Self-pay

## 2016-06-30 MED ORDER — SODIUM CHLORIDE 0.9 % IV BOLUS (SEPSIS)
1000.0000 mL | Freq: Once | INTRAVENOUS | Status: AC
  Administered 2016-06-30: 1000 mL via INTRAVENOUS

## 2016-06-30 MED ORDER — IOPAMIDOL (ISOVUE-300) INJECTION 61%
INTRAVENOUS | Status: AC
  Administered 2016-06-30: 100 mL
  Filled 2016-06-30: qty 100

## 2016-06-30 MED ORDER — MORPHINE SULFATE (PF) 4 MG/ML IV SOLN
4.0000 mg | Freq: Once | INTRAVENOUS | Status: AC
  Administered 2016-06-30: 4 mg via INTRAVENOUS
  Filled 2016-06-30: qty 1

## 2016-06-30 MED ORDER — HYDROCODONE-ACETAMINOPHEN 5-325 MG PO TABS
1.0000 | ORAL_TABLET | Freq: Four times a day (QID) | ORAL | Status: DC | PRN
Start: 2016-06-30 — End: 2016-08-01

## 2016-06-30 NOTE — ED Notes (Signed)
Patient transported to CT 

## 2016-06-30 NOTE — ED Provider Notes (Signed)
CSN: [id number]     Arrival date & time 06/29/16  2138 History  By signing my name below, I, Doreatha MartinEva Mathews, attest that this documentation has been prepared under the direction and in the presence of Azalia BilisKevin Jaquelynn Wanamaker, MD. Electronically Signed: Doreatha MartinEva Mathews, ED Scribe. 06/30/2016. 1:18 AM.    Chief Complaint  Patient presents with  . Hematochezia  . Abdominal Pain   The history is provided by the patient. No language interpreter was used.   HPI Comments: Albert Welch is a 30 y.o. male otherwise healthy who presents to the Emergency Department complaining of moderate, intermittent lower abdominal pain onset 5 days ago. Pt also complains of bright red blood streaking in stool today, abdominal distension at night for 2 days, constipation and urinary hesitancy. No worsening or alleviating factors noted. Pt states he has been eating and drinking less with his symptoms, but tolerating food and fluids. Per pt, he had an episode of similar abdominal pain 2 months ago that resolved on its own. No h/o abdominal surgery. No h/o similar intermittent abdominal pains prior to last year. No recent weight changes. No daily medications. No FHx of similar abdominal ailments. He denies fever, nausea, emesis, diarrhea, dysuria, hematuria.   Past Medical History  Diagnosis Date  . Seasonal allergies   . GERD (gastroesophageal reflux disease)   . H. pylori infection    Past Surgical History  Procedure Laterality Date  . None     Family History  Problem Relation Age of Onset  . Diabetes Mother   . Kidney disease Mother     as a child  . Colon cancer Neg Hx   . Esophageal cancer Neg Hx    Social History  Substance Use Topics  . Smoking status: Former Smoker    Quit date: 05/25/2010  . Smokeless tobacco: Never Used  . Alcohol Use: Yes     Comment: rare    Review of Systems A complete 10 system review of systems was obtained and all systems are negative except as noted in the HPI and PMH.     Allergies  Review of patient's allergies indicates no known allergies.  Home Medications   Prior to Admission medications   Medication Sig Start Date End Date Taking? Authorizing Provider  albuterol (PROVENTIL HFA;VENTOLIN HFA) 108 (90 BASE) MCG/ACT inhaler Inhale 1-2 puffs into the lungs every 6 (six) hours as needed for wheezing. 01/29/13   Jimmie MollyPaolo Coll, MD  ALPRAZolam Prudy Feeler(XANAX) 0.5 MG tablet Take one tab every morning, then every 8 hours as needed 03/13/14   Louis Meckelobert D Kaplan, MD  Cetirizine HCl (ZYRTEC ALLERGY) 10 MG CAPS Take 1 capsule (10 mg total) by mouth daily. X 2 weeks 01/29/13   Jimmie MollyPaolo Coll, MD  Hyoscyamine Sulfate 0.375 MG TBCR Take one tab twice a day as needed, abdominal pain 01/27/15   Louis Meckelobert D Kaplan, MD  lansoprazole (PREVACID) 30 MG capsule Take 1 capsule (30 mg total) by mouth daily at 12 noon. 01/27/15   Louis Meckelobert D Kaplan, MD  trimethoprim-polymyxin b (POLYTRIM) ophthalmic solution Place 2 drops into the left eye every 4 (four) hours. X 7 days 09/28/14   Jess BartersJennifer Lee H Presson, PA   BP 134/90 mmHg  Pulse 70  Temp(Src) 98.1 F (36.7 C) (Oral)  Resp 17  Ht 5\' 7"  (1.702 m)  Wt 172 lb 9 oz (78.274 kg)  BMI 27.02 kg/m2  SpO2 100% Physical Exam  Constitutional: He is oriented to person, place, and time. He appears well-developed and  well-nourished.  HENT:  Head: Normocephalic and atraumatic.  Eyes: EOM are normal.  Neck: Normal range of motion.  Cardiovascular: Normal rate, regular rhythm, normal heart sounds and intact distal pulses.   Pulmonary/Chest: Effort normal and breath sounds normal. No respiratory distress.  Abdominal: Soft. He exhibits no distension. There is tenderness. There is no rebound and no guarding.  LLQ tenderness   Genitourinary:  Normal perirectal region.  No gross abnormalities.  Musculoskeletal: Normal range of motion.  Neurological: He is alert and oriented to person, place, and time.  Skin: Skin is warm and dry.  Psychiatric: He has a normal mood and  affect. Judgment normal.  Nursing note and vitals reviewed.   ED Course  Procedures (including critical care time) DIAGNOSTIC STUDIES: Oxygen Saturation is 100% on RA, normal by my interpretation.    COORDINATION OF CARE: 12:59 AM Discussed treatment plan with pt at bedside which includes lab work, CT A/P and pt agreed to plan.   Labs Review Labs Reviewed  COMPREHENSIVE METABOLIC PANEL - Abnormal; Notable for the following:    Potassium 3.4 (*)    All other components within normal limits  CBC  URINALYSIS, ROUTINE W REFLEX MICROSCOPIC (NOT AT [facility])    Imaging Review Ct Abdomen Pelvis W Contrast  06/30/2016  CLINICAL DATA:  Left lower quadrant pain with bloody stools. EXAM: CT ABDOMEN AND PELVIS WITH CONTRAST TECHNIQUE: Multidetector CT imaging of the abdomen and pelvis was performed using the standard protocol following bolus administration of intravenous contrast. CONTRAST:  100mL ISOVUE-300 IOPAMIDOL (ISOVUE-300) INJECTION 61% COMPARISON:  None. FINDINGS: Lower chest:  The included lung bases are clear. Liver: No focal lesion. Hepatobiliary: Gallbladder physiologically distended, no calcified stone. No biliary dilatation. Pancreas: No ductal dilatation or inflammation. Spleen: Normal. Adrenal glands: No nodule. Kidneys: Symmetric renal enhancement. No hydronephrosis. No perinephric edema. No evidence of urolithiasis. Stomach/Bowel: Stomach physiologically distended. There are no dilated or thickened small bowel loops. Moderate stool in the right colon. Sigmoid colonic redundancy. No colonic wall thickening. No significant diverticular disease. The appendix is normal. Vascular/Lymphatic: No retroperitoneal adenopathy. Abdominal aorta is normal in caliber. Accessory left renal artery. Reproductive: Normal. Bladder: Physiologically distended, no wall thickening. Other: No free air, free fluid, or intra-abdominal fluid collection. Musculoskeletal: There are no acute or suspicious osseous  abnormalities. IMPRESSION: No acute abnormality in the abdomen/pelvis. No explanation for left lower quadrant pain. Electronically Signed   By: Rubye OaksMelanie  Ehinger M.D.   On: 06/30/2016 02:36   I have personally reviewed and evaluated these images and lab results as part of my medical decision-making.   EKG Interpretation None      MDM   Final diagnoses:  Left lower quadrant pain  Lower GI bleeding    Patient is overall well-appearing.  I've asked that he follow-up with gastroenterology as he has seen Dr. Arlyce DiceKaplan before in the past.  He may likely benefit from either sigmoidoscopy in the office or full colonoscopy.  No active bleeding at this time.  Hemodynamically stable.  Hemoglobin is stable. CT scan without acute pathology.  He understands to eturn to the ER for new or worsening symptoms  I personally performed the services described in this documentation, which was scribed in my presence. The recorded information has been reviewed and is accurate.       Azalia BilisKevin Madysun Thall, MD 06/30/16 [phone]

## 2016-06-30 NOTE — Discharge Instructions (Signed)
Dolor abdominal en adultos °(Abdominal Pain, Adult) °El dolor puede tener muchas causas. Normalmente la causa del dolor abdominal no es una enfermedad y mejorará sin tratamiento. Frecuentemente puede controlarse y tratarse en casa. Su médico le realizará un examen físico y posiblemente solicite análisis de sangre y radiografías para ayudar a determinar la gravedad de su dolor. Sin embargo, en muchos casos, debe transcurrir más tiempo antes de que se pueda encontrar una causa evidente del dolor. Antes de llegar a ese punto, es posible que su médico no sepa si necesita más pruebas o un tratamiento más profundo. °INSTRUCCIONES PARA EL CUIDADO EN EL HOGAR  °Esté atento al dolor para ver si hay cambios. Las siguientes indicaciones ayudarán a aliviar cualquier molestia que pueda sentir: °· Tome solo medicamentos de venta libre o recetados, según las indicaciones del médico. °· No tome laxantes a menos que se lo haya indicado su médico. °· Pruebe con una dieta líquida absoluta (caldo, té o agua) según se lo indique su médico. Introduzca gradualmente una dieta normal, según su tolerancia. °SOLICITE ATENCIÓN MÉDICA SI: °· Tiene dolor abdominal sin explicación. °· Tiene dolor abdominal relacionado con náuseas o diarrea. °· Tiene dolor cuando orina o defeca. °· Experimenta dolor abdominal que lo despierta de noche. °· Tiene dolor abdominal que empeora o mejora cuando come alimentos. °· Tiene dolor abdominal que empeora cuando come alimentos grasosos. °· Tiene fiebre. °SOLICITE ATENCIÓN MÉDICA DE INMEDIATO SI:  °· El dolor no desaparece en un plazo máximo de 2 horas. °· No deja de (vomitar). °· El dolor se siente solo en partes del abdomen, como el lado derecho o la parte inferior izquierda del abdomen. °· Evacúa materia fecal sanguinolenta o negra, de aspecto alquitranado. °ASEGÚRESE DE QUE: °· Comprende estas instrucciones. °· Controlará su afección. °· Recibirá ayuda de inmediato si no mejora o si empeora. °  °Esta  información no tiene como fin reemplazar el consejo del médico. Asegúrese de hacerle al médico cualquier pregunta que tenga. °  °Document Released: 12/11/2005 Document Revised: 01/01/2015 °Elsevier Interactive Patient Education ©2016 Elsevier Inc. ° °

## 2016-08-01 ENCOUNTER — Ambulatory Visit (INDEPENDENT_AMBULATORY_CARE_PROVIDER_SITE_OTHER): Payer: Self-pay | Admitting: Gastroenterology

## 2016-08-01 ENCOUNTER — Encounter: Payer: Self-pay | Admitting: Gastroenterology

## 2016-08-01 VITALS — BP 112/74 | HR 72 | Ht 67.0 in | Wt 174.5 lb

## 2016-08-01 DIAGNOSIS — R1032 Left lower quadrant pain: Secondary | ICD-10-CM

## 2016-08-01 DIAGNOSIS — K625 Hemorrhage of anus and rectum: Secondary | ICD-10-CM

## 2016-08-01 DIAGNOSIS — K5900 Constipation, unspecified: Secondary | ICD-10-CM

## 2016-08-01 NOTE — Patient Instructions (Addendum)
Para constipacion:  toma uno Ducosate 100 mg un vez al dia  Tambien, toma un dosis de la pulva se llama Miralax (en agua) un vez al dia. Si tiene diarrhea, pare la miralax  Se puede Delta Air Linescomprar los dos a la farmacia sin receta  You have been scheduled for a colonoscopy. Please follow written instructions given to you at your visit today.  Please pick up your prep supplies at the pharmacy within the next 1-3 days. If you use inhalers (even only as needed), please bring them with you on the day of your procedure. Your physician has requested that you go to www.startemmi.com and enter the access code given to you at your visit today. This web site gives a general overview about your procedure. However, you should still follow specific instructions given to you by our office regarding your preparation for the procedure.  If you are age 30 or older, your body mass index should be between 23-30. Your Body mass index is 27.33 kg/m. If this is out of the aforementioned range listed, please consider follow up with your Primary Care Provider.  If you are age 30 or younger, your body mass index should be between 19-25. Your Body mass index is 27.33 kg/m. If this is out of the aformentioned range listed, please consider follow up with your Primary Care Provider.   Thank you for choosing [facility]  Dr Amada JupiterHenry Danis III

## 2016-08-01 NOTE — Progress Notes (Signed)
[facility] Progress Note  Chief Complaint: Lower abd pain and rectal bleeding  Subjective  History:  Seen with video-phone interpreter. Saw kaplan 2015 and 2016 for dyspepsia.  H pylori treated.  Suspected anxiety component.  Recent ED for LLQ pain , constipation and rectal bleeding.  Another episode bleeding yesterday, chronic constipation and crampy lower abd pain.  ROS: Cardiovascular:  no chest pain Respiratory: no dyspnea  The patient's Past Medical, Family and Social History were reviewed and are on file in the EMR. No family history of colon or rectal cancer.  Objective:  Med list reviewed  Vital signs in last 24 hrs: Vitals:   08/01/16 1602  BP: 112/74  Pulse: 72    Physical Exam   HEENT: sclera anicteric, oral mucosa moist without lesions  Neck: supple, no thyromegaly, JVD or lymphadenopathy  Cardiac: RRR without murmurs, S1S2 heard, no peripheral edema  Pulm: clear to auscultation bilaterally, normal RR and effort noted  Abdomen: soft, no tenderness, with active bowel sounds. No guarding or palpable hepatosplenomegaly.  Skin; warm and dry, no jaundice or rash  Recent Labs: CBC    Component Value Date/Time   WBC 8.4 06/29/2016 2147   RBC 5.36 06/29/2016 2147   HGB 16.3 06/29/2016 2147   HCT 46.8 06/29/2016 2147   PLT 239 06/29/2016 2147   MCV 87.3 06/29/2016 2147   MCH 30.4 06/29/2016 2147   MCHC 34.8 06/29/2016 2147   RDW 12.6 06/29/2016 2147   LYMPHSABS 2.8 06/20/2008 2019   MONOABS 0.8 06/20/2008 2019   EOSABS 0.2 06/20/2008 2019   BASOSABS 0.0 06/20/2008 2019      Radiologic studies:    @ASSESSMENTPLANBEGIN @ Assessment: Encounter Diagnoses  Name Primary?  . Rectal bleeding Yes  . Constipation, unspecified constipation type   . LLQ abdominal pain    Most likely benign bleeding related to constipation, but must have colonoscopy to rule out neoplasia.  Plan:  Colonoscopy.  The benefits and risks of the planned procedure were  described in detail with the patient or (when appropriate) their health care proxy.  Risks were outlined as including, but not limited to, bleeding, infection, perforation, adverse medication reaction leading to cardiac or pulmonary decompensation, or pancreatitis (if ERCP).  The limitation of incomplete mucosal visualization was also discussed.  No guarantees or warranties were given.  Colace/miralax plan given for constipation.  Charlie PitterHenry L Danis III

## 2016-08-21 ENCOUNTER — Encounter: Payer: Self-pay | Admitting: Gastroenterology

## 2016-08-21 ENCOUNTER — Ambulatory Visit: Payer: Self-pay | Admitting: Gastroenterology

## 2016-08-21 VITALS — BP 118/70 | HR 63 | Temp 99.3°F | Ht 67.0 in | Wt 164.0 lb

## 2016-08-21 MED ORDER — SODIUM CHLORIDE 0.9 % IV SOLN: 500.0000 mL | INTRAVENOUS | Status: DC

## 2016-08-21 NOTE — Progress Notes (Signed)
Patient stated that he had a cup of water at 1:30pm today.  He also stated that he ate yesterday at 4pm.  When asked about this stool he stated that his tool was mushy.  The interpretor with him stated that "it was probably a language barrier."   He will be rescheduled at the first available per his request.  He will have another previsit and will be rescheduled for another colonoscopy by Francie MassingBarbara Benitas at this time.  IV removed, and patient got dressed.  Interpretor with him at this time.  Wife aware that patient has to be rescheduled due to the drinking and the solid stool.   This colonoscopy was cancelled at this time.

## 2016-08-21 NOTE — Progress Notes (Signed)
Dr is aware, and stated that the patient should be rescheduled.

## 2016-08-29 ENCOUNTER — Telehealth: Payer: Self-pay

## 2016-08-29 NOTE — Telephone Encounter (Signed)
Yes, that would be fine. His prep was also poor because he ate solid food the entire day prior to procedure

## 2016-08-29 NOTE — Telephone Encounter (Signed)
Dr Myrtie Neitheranis,       Pt recently cancelled for poor prep.  OK to give the usual Miralax/Suprep 2 day prep?                                                                            Angela/PV

## 2016-08-30 NOTE — Telephone Encounter (Signed)
Pt can only pay for Miralax.  Will send [name] a note for [employer]Suprep Sample.                                            Albert LandAngela

## 2016-08-31 ENCOUNTER — Ambulatory Visit (AMBULATORY_SURGERY_CENTER): Payer: Self-pay | Admitting: *Deleted

## 2016-08-31 VITALS — Ht 67.0 in | Wt 169.0 lb

## 2016-08-31 DIAGNOSIS — K625 Hemorrhage of anus and rectum: Secondary | ICD-10-CM

## 2016-08-31 MED ORDER — NA SULFATE-K SULFATE-MG SULF 17.5-3.13-1.6 GM/177ML PO SOLN: 1.0000 | Freq: Once | ORAL | 0 refills | Status: AC

## 2016-08-31 NOTE — Progress Notes (Signed)
No egg or soy allergy known to patient  No issues with past sedation with any surgeries  or procedures, no intubation problems  No diet pills per patient No home 02 use per patient  No blood thinners per patient  Pt states  issues with constipation - states issues are daily - uses no medicine for this issue per pt.  No A fib or A flutter  Pt and I used the video interpreter this am to discuss instructions. Pt's questions answered  Samples of this drug were given to the patient, quantity 1 suprep , Lot Number [id number] exp 7/19 marie PV

## 2016-09-04 ENCOUNTER — Encounter: Payer: Self-pay | Admitting: Gastroenterology

## 2016-09-04 ENCOUNTER — Ambulatory Visit (AMBULATORY_SURGERY_CENTER): Payer: Self-pay | Admitting: Gastroenterology

## 2016-09-04 VITALS — BP 107/68 | HR 67 | Temp 98.9°F | Resp 18 | Ht 67.0 in | Wt 169.0 lb

## 2016-09-04 DIAGNOSIS — K625 Hemorrhage of anus and rectum: Secondary | ICD-10-CM

## 2016-09-04 MED ORDER — SODIUM CHLORIDE 0.9 % IV SOLN: 500.0000 mL | INTRAVENOUS | Status: AC

## 2016-09-04 NOTE — Patient Instructions (Signed)
YOU HAD AN ENDOSCOPIC PROCEDURE TODAY AT THE [facility]:   Refer to the procedure report that was given to you for any specific questions about what was found during the examination.  If the procedure report does not answer your questions, please call your gastroenterologist to clarify.  If you requested that your care partner not be given the details of your procedure findings, then the procedure report has been included in a sealed envelope for you to review at your convenience later.  YOU SHOULD EXPECT: Some feelings of bloating in the abdomen. Passage of more gas than usual.  Walking can help get rid of the air that was put into your GI tract during the procedure and reduce the bloating. If you had a lower endoscopy (such as a colonoscopy or flexible sigmoidoscopy) you may notice spotting of blood in your stool or on the toilet paper. If you underwent a bowel prep for your procedure, you may not have a normal bowel movement for a few days.  Please Note:  You might notice some irritation and congestion in your nose or some drainage.  This is from the oxygen used during your procedure.  There is no need for concern and it should clear up in a day or so.  SYMPTOMS TO REPORT IMMEDIATELY:   Following lower endoscopy (colonoscopy or flexible sigmoidoscopy):  Excessive amounts of blood in the stool  Significant tenderness or worsening of abdominal pains  Swelling of the abdomen that is new, acute  Fever of 100F or higher  For urgent or emergent issues, a gastroenterologist can be reached at any hour by calling [phone].   DIET:  We do recommend a small meal at first, but then you may proceed to your regular diet.  Drink plenty of fluids but you should avoid alcoholic beverages for 24 hours.  ACTIVITY:  You should plan to take it easy for the rest of today and you should NOT DRIVE or use heavy machinery until tomorrow (because of the sedation medicines used during the test).     FOLLOW UP: Our staff will call the number listed on your records the next business day following your procedure to check on you and address any questions or concerns that you may have regarding the information given to you following your procedure. If we do not reach you, we will leave a message.  However, if you are feeling well and you are not experiencing any problems, there is no need to return our call.  We will assume that you have returned to your regular daily activities without incident.  SIGNATURES/CONFIDENTIALITY: You and/or your care partner have signed paperwork which will be entered into your electronic medical record.  These signatures attest to the fact that that the information above on your After Visit Summary has been reviewed and is understood.  Full responsibility of the confidentiality of this discharge information lies with you and/or your care-partner.  Please continue your normal medications, including present bowel regimen of daily stool softener and as needed Miralax

## 2016-09-04 NOTE — Progress Notes (Signed)
Pt has an interpreter, eduardo Sobalvarro CAPS will interp for the pt. maw

## 2016-09-04 NOTE — Op Note (Signed)
[facility] Patient Name: Albert Welch Procedure Date: 09/04/2016 11:07 AM MRN: [id number] Endoscopist: Sherilyn Cooter L. Myrtie Neither , MD Age: 30 Referring MD:  Date of Birth: 11/01/1986 Gender: Male Account #: [id number] Procedure:                Colonoscopy Indications:              Rectal bleeding Medicines:                Monitored Anesthesia Care Procedure:                Pre-Anesthesia Assessment:                           - Prior to the procedure, a History and Physical                            was performed, and patient medications and                            allergies were reviewed. The patient's tolerance of                            previous anesthesia was also reviewed. The risks                            and benefits of the procedure and the sedation                            options and risks were discussed with the patient.                            All questions were answered, and informed consent                            was obtained. Prior Anticoagulants: The patient has                            taken no previous anticoagulant or antiplatelet                            agents. ASA Grade Assessment: II - A patient with                            mild systemic disease. After reviewing the risks                            and benefits, the patient was deemed in                            satisfactory condition to undergo the procedure.                           After obtaining informed consent, the colonoscope  was passed under direct vision. Throughout the                            procedure, the patient's blood pressure, pulse, and                            oxygen saturations were monitored continuously. The                            Model CF-HQ190L [id number]) scope was introduced                            through the anus and advanced to the the cecum,                            identified by appendiceal orifice and ileocecal                             valve. The colonoscopy was performed without                            difficulty. The patient tolerated the procedure                            well. The quality of the bowel preparation was                            evaluated using the BBPS [facility] Bowel Preparation                            Scale) with scores of: Right Colon = 2, Transverse                            Colon = 3 and Left Colon = 3. The total BBPS score                            equals 8. The bowel preparation used was Miralax.                            The ileocecal valve, appendiceal orifice, and                            rectum were photographed. Scope In: 11:14:10 AM Scope Out: 11:26:31 AM Scope Withdrawal Time: 0 hours 8 minutes 51 seconds  Total Procedure Duration: 0 hours 12 minutes 21 seconds  Findings:                 The perianal and digital rectal examinations were                            normal.                           The entire examined colon appeared normal on direct  and retroflexion views. Complications:            No immediate complications. Estimated Blood Loss:     Estimated blood loss: none. Impression:               - The entire examined colon is normal on direct and                            retroflexion views.                           - No specimens collected.                           Benign anal bleeding related to constipation. Recommendation:           - Patient has a contact number available for                            emergencies. The signs and symptoms of potential                            delayed complications were discussed with the                            patient. Return to normal activities tomorrow.                            Written discharge instructions were provided to the                            patient.                           - Resume previous diet.                           - Continue present  bowel regimen of daily stool                            softener and as-needed miralax.                           - No recommendation at this time regarding repeat                            colonoscopy due to young age. Henry L. Myrtie Neitheranis, MD 09/04/2016 11:31:31 AM This report has been signed electronically.

## 2016-09-04 NOTE — Progress Notes (Signed)
Report to PACU, RN, vss, BBS= Clear.  

## 2016-09-05 ENCOUNTER — Telehealth: Payer: Self-pay | Admitting: *Deleted

## 2016-09-05 NOTE — Telephone Encounter (Signed)
  Follow up Call-  Call back number 09/04/2016 08/21/2016 02/17/2014  Post procedure Call Back phone  # #[phone] Dr[place] - PT HAS THE SERVICE TO INTERPRETE FOR HIM.  He does not speak english. [phone]  Permission to leave phone message Yes Yes Yes  Some recent data might be hidden     Patient questions:  Do you have a fever, pain , or abdominal swelling? No. Pain Score  0 *  Have you tolerated food without any problems? Yes.    Have you been able to return to your normal activities? Yes.    Do you have any questions about your discharge instructions: Diet   No. Medications  No. Follow up visit  No.  Do you have questions or concerns about your Care? No.  Actions: * If pain score is 4 or above: No action needed, pain <4.

## 2016-12-06 IMAGING — CT CT ABD-PELV W/ CM
2 of 4 series · 11 of 46 positions shown, 12 images · IV contrast (Iodine)
Comparison: None.

CLINICAL DATA: Left lower quadrant pain with bloody stools.

EXAM:
CT ABDOMEN AND PELVIS WITH CONTRAST
TECHNIQUE: Multidetector CT imaging of the abdomen and pelvis was performed
using the standard protocol following bolus administration of
intravenous contrast.
CONTRAST:  100mL 0HT8PU-L66 IOPAMIDOL (0HT8PU-L66) INJECTION 61%

[Series 201: routine, idose (2) · axial · 0.78mm/px · z∈[+102,+502]mm · 8 of 96 slices shown, 9 images]
[im 8/96  soft-tissue]
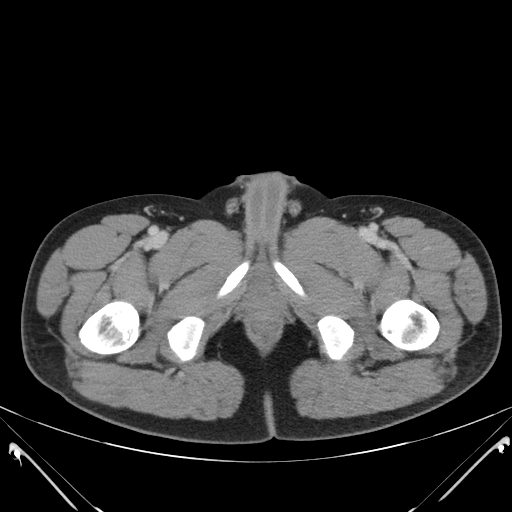
[im 8/96  bone]
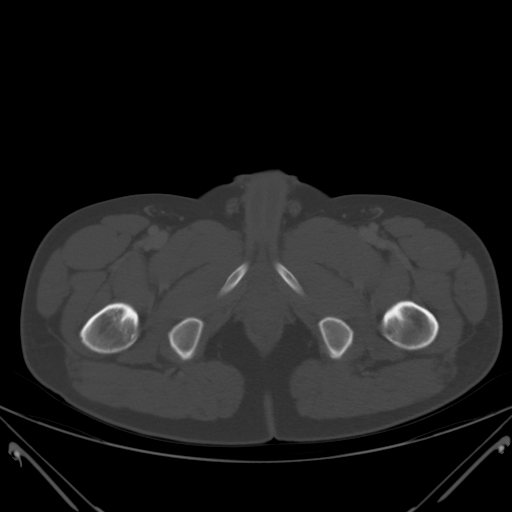
[im 20/96  soft-tissue]
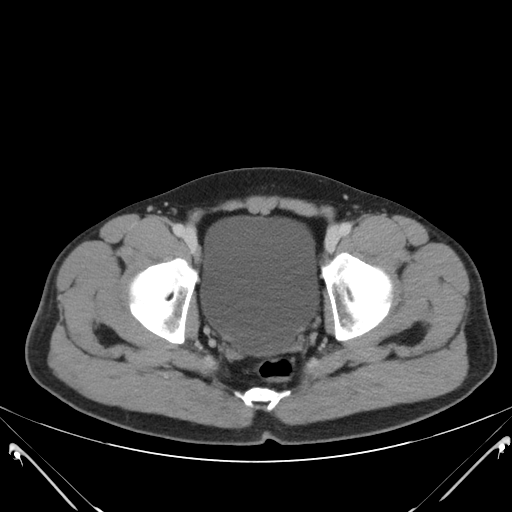
[im 32/96  soft-tissue]
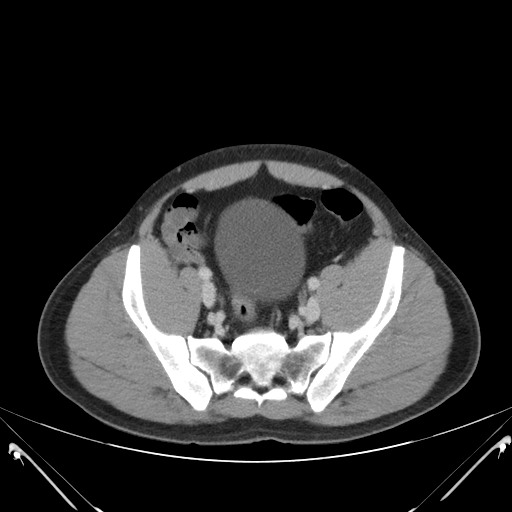
[im 44/96  soft-tissue]
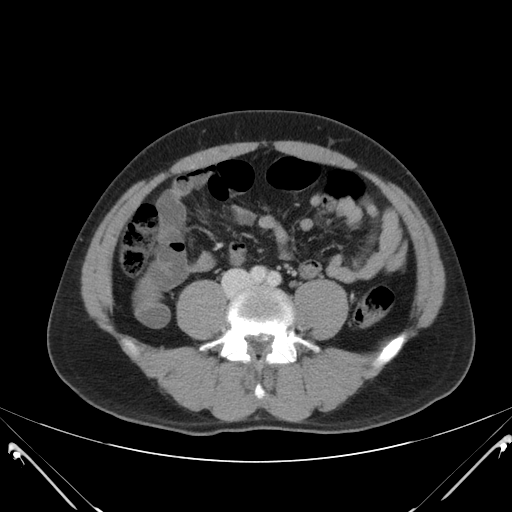
[im 52/96  soft-tissue]
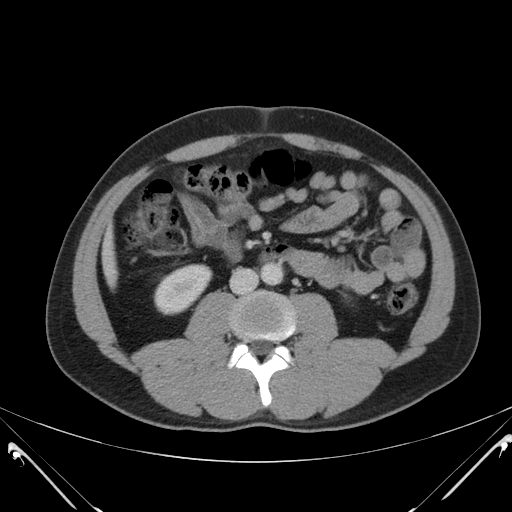
[im 64/96  soft-tissue]
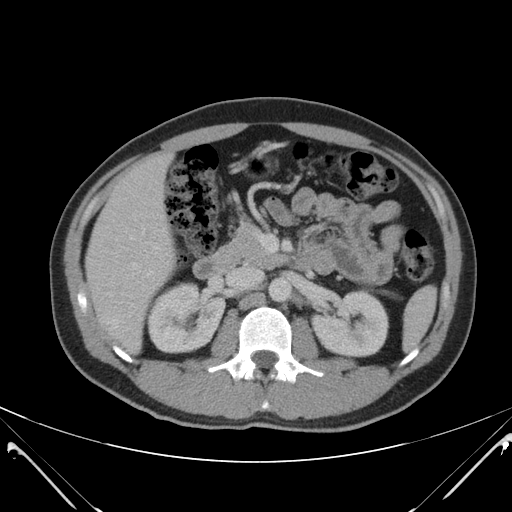
[im 76/96  soft-tissue]
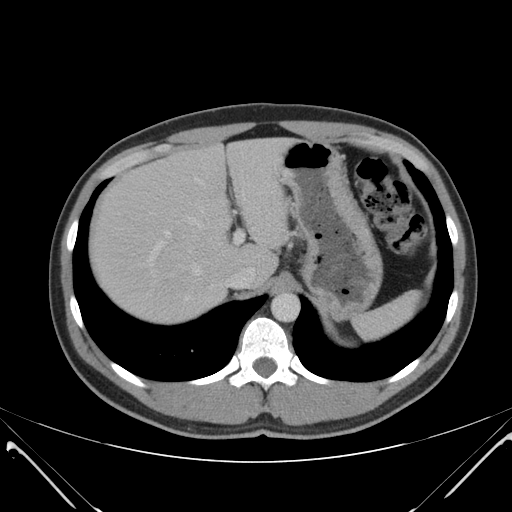
[im 88/96  soft-tissue]
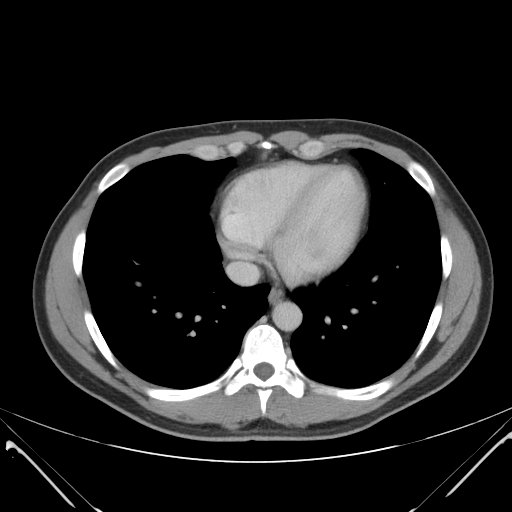

[Series 203: coronals, idose (2) · coronal · 0.45mm/px · 3 of 112 slices shown]
[im 38/112  soft-tissue]
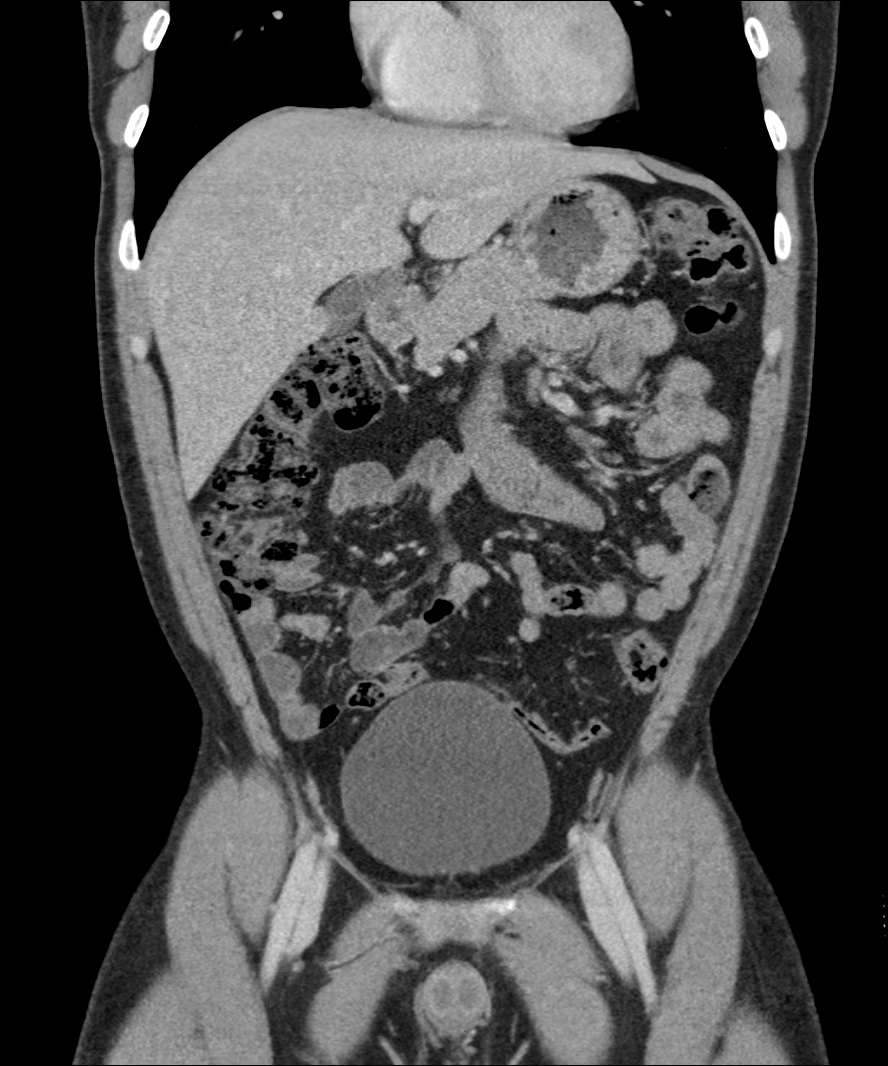
[im 50/112  soft-tissue]
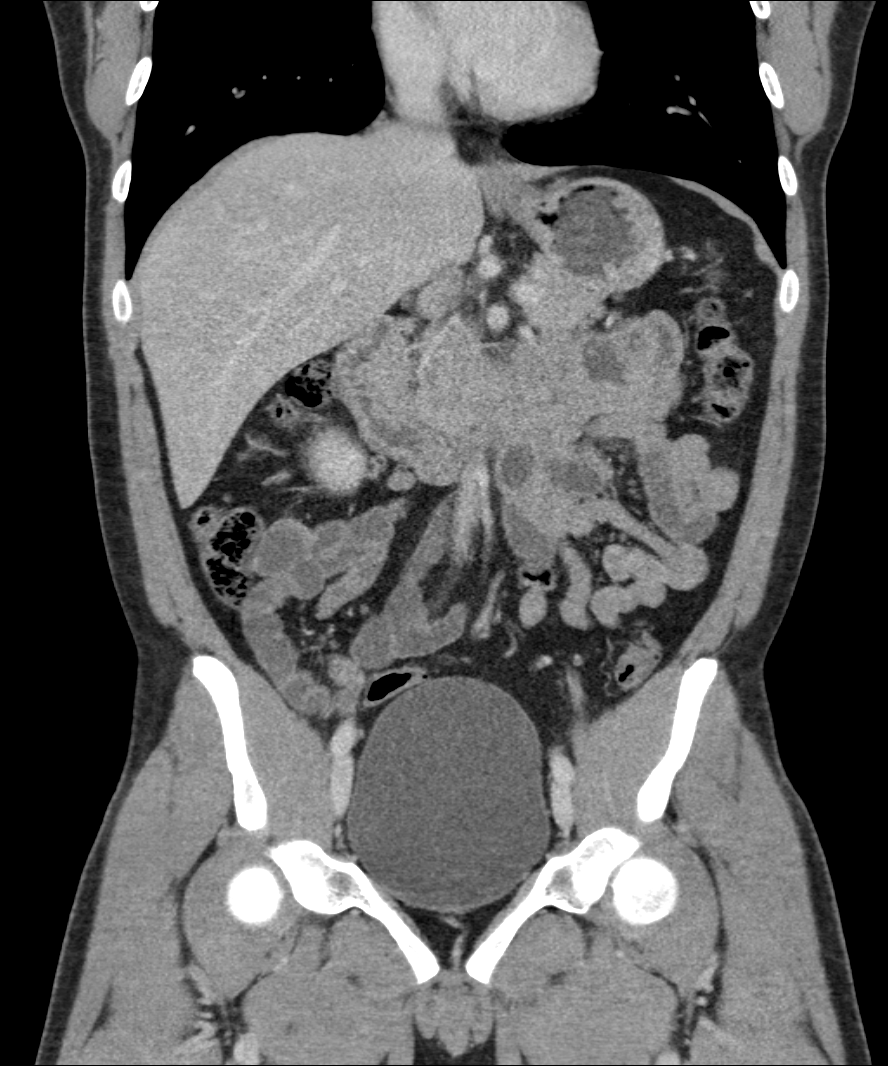
[im 62/112  soft-tissue]
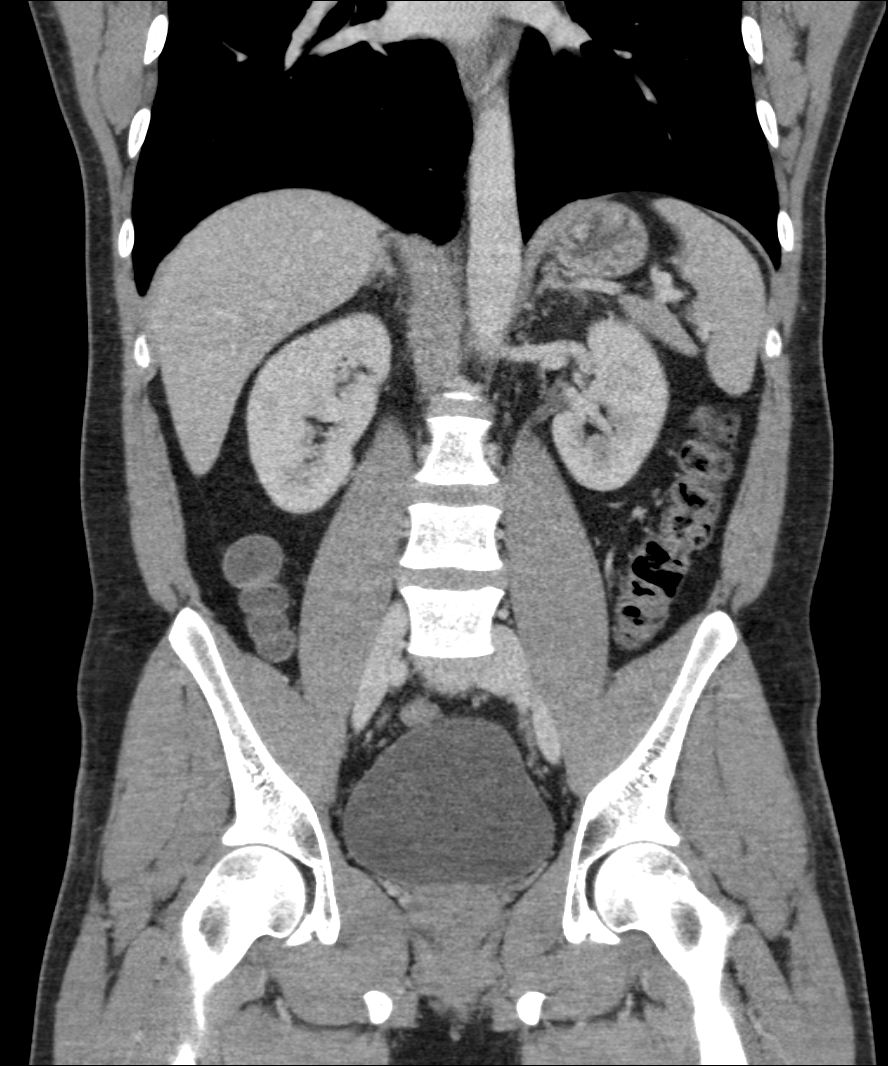

[11 of 46 positions shown; findings below may reference images not displayed]

FINDINGS: Lower chest:  The included lung bases are clear.

Liver: No focal lesion.

Hepatobiliary: Gallbladder physiologically distended, no calcified
stone. No biliary dilatation.

Pancreas: No ductal dilatation or inflammation.

Spleen: Normal.

Adrenal glands: No nodule.

Kidneys: Symmetric renal enhancement. No hydronephrosis. No
perinephric edema. No evidence of urolithiasis.

Stomach/Bowel: Stomach physiologically distended. There are no
dilated or thickened small bowel loops. Moderate stool in the right
colon. Sigmoid colonic redundancy. No colonic wall thickening. No
significant diverticular disease. The appendix is normal.

Vascular/Lymphatic: No retroperitoneal adenopathy. Abdominal aorta
is normal in caliber. Accessory left renal artery.

Reproductive: Normal.

Bladder: Physiologically distended, no wall thickening.

Other: No free air, free fluid, or intra-abdominal fluid collection.

Musculoskeletal: There are no acute or suspicious osseous
abnormalities.
IMPRESSION: No acute abnormality in the abdomen/pelvis. No explanation for left
lower quadrant pain.

## 2017-01-16 ENCOUNTER — Ambulatory Visit (HOSPITAL_COMMUNITY)
Admission: EM | Admit: 2017-01-16 | Discharge: 2017-01-16 | Disposition: A | Discharge status: Home / Self Care | Payer: Self-pay | Attending: Family Medicine | Admitting: Family Medicine

## 2017-01-16 ENCOUNTER — Encounter (HOSPITAL_COMMUNITY): Payer: Self-pay | Admitting: *Deleted

## 2017-01-16 DIAGNOSIS — R42 Dizziness and giddiness: Secondary | ICD-10-CM

## 2017-01-16 MED ORDER — MECLIZINE HCL 12.5 MG PO TABS: 12.5000 mg | ORAL_TABLET | Freq: Three times a day (TID) | ORAL | 0 refills | Status: DC | PRN

## 2017-01-16 NOTE — ED Triage Notes (Signed)
Patient reports 14 day history of dizziness, states comes and goes, denies any ear discomfort. States he feels anxious. Patient primarily speaks spanish.

## 2017-01-16 NOTE — ED Provider Notes (Signed)
CSN: [id number]     Arrival date & time 01/16/17  1808 History   None    Chief Complaint  Patient presents with  . Dizziness   (Consider location/radiation/quality/duration/timing/severity/associated sxs/prior Treatment) 31 year old male presents to clinic with 14 day history of dizziness. His symptoms are worse with movement, particularly when he is laying down. He denies any recent illness, has no difficulty hearing or sensation or pain or fullness in his ears. He drinks adequate fluid intake, symptoms are not worsened by standing, he has no blurring of the vision or other vision problems.   The history is provided by the patient.  Dizziness    Past Medical History:  Diagnosis Date  . Allergy   . GERD (gastroesophageal reflux disease)   . H. pylori infection   . Seasonal allergies    Past Surgical History:  Procedure Laterality Date  . none     Family History  Problem Relation Age of Onset  . Diabetes Mother   . Kidney disease Mother     as a child  . Colon cancer Neg Hx   . Esophageal cancer Neg Hx   . Rectal cancer Neg Hx   . Stomach cancer Neg Hx    Social History  Substance Use Topics  . Smoking status: Former Smoker    Quit date: 05/25/2010  . Smokeless tobacco: Never Used  . Alcohol use Yes     Comment: rare    Review of Systems  Reason unable to perform ROS: as covered in HPI.  Neurological: Positive for dizziness.  All other systems reviewed and are negative.   Allergies  Patient has no known allergies.  Home Medications   Prior to Admission medications   Medication Sig Start Date End Date Taking? Authorizing Provider  meclizine (ANTIVERT) 12.5 MG tablet Take 1 tablet (12.5 mg total) by mouth 3 (three) times daily as needed for dizziness. 01/16/17   Dorena BodoLawrence Malikhi Ogan, NP  morphine (MSIR) 15 MG tablet Take 15 mg by mouth every 6 (six) hours as needed for severe pain.    Historical Provider, MD   Meds Ordered and Administered this Visit  Medications -  No data to display  BP 131/96 (BP Location: Left Arm)   Pulse 69   Temp 98.1 F (36.7 C) (Oral)   Resp 16   SpO2 100%  No data found.   Physical Exam  Constitutional: He is oriented to person, place, and time. He appears well-developed and well-nourished. No distress.  HENT:  Head: Normocephalic and atraumatic.  Right Ear: Tympanic membrane and external ear normal.  Left Ear: Tympanic membrane and external ear normal.  Nose: Nose normal.  Mouth/Throat: Oropharynx is clear and moist.  Eyes: Conjunctivae and EOM are normal. Pupils are equal, round, and reactive to light.  Neck: Normal range of motion. Neck supple.  Cardiovascular: Normal rate and regular rhythm.   Pulmonary/Chest: Effort normal.  Abdominal: Soft. Bowel sounds are normal.  Musculoskeletal: Normal range of motion.  Neurological: He is alert and oriented to person, place, and time.  Skin: Skin is warm and dry. Capillary refill takes less than 2 seconds. He is not diaphoretic.  Psychiatric: He has a normal mood and affect.  Nursing note and vitals reviewed.   Urgent Care Course     Procedures (including critical care time)  Labs Review Labs Reviewed - No data to display  Imaging Review No results found.   Visual Acuity Review  Right Eye Distance:   Left Eye  Distance:   Bilateral Distance:    Right Eye Near:   Left Eye Near:    Bilateral Near:         MDM   1. Vertigo    I think you most likely have vertigo, I have prescribed a medication called Meclizine to treat your dizziness.  Take one tablet up to three times a day as needed for your symptoms. Should your symptoms fail to resolve or worsen, follow up with a primary care provider or return to clinic.      Dorena Bodo, NP 01/16/17 2048

## 2017-01-16 NOTE — Discharge Instructions (Signed)
I think you most likely have vertigo, I have prescribed a medication called Meclizine to treat your dizziness.  Take one tablet up to three times a day as needed for your symptoms. Should your symptoms fail to resolve or worsen, follow up with a primary care provider or return to clinic.

## 2017-04-20 ENCOUNTER — Emergency Department (HOSPITAL_COMMUNITY): Payer: Self-pay

## 2017-04-20 ENCOUNTER — Emergency Department (HOSPITAL_COMMUNITY)
Admission: EM | Admit: 2017-04-20 | Discharge: 2017-04-20 | Disposition: A | Discharge status: Home / Self Care | Payer: Self-pay | Attending: Emergency Medicine | Admitting: Emergency Medicine

## 2017-04-20 ENCOUNTER — Encounter (HOSPITAL_COMMUNITY): Payer: Self-pay | Admitting: *Deleted

## 2017-04-20 DIAGNOSIS — Z87891 Personal history of nicotine dependence: Secondary | ICD-10-CM | POA: Insufficient documentation

## 2017-04-20 DIAGNOSIS — Z79899 Other long term (current) drug therapy: Secondary | ICD-10-CM | POA: Insufficient documentation

## 2017-04-20 DIAGNOSIS — R197 Diarrhea, unspecified: Secondary | ICD-10-CM | POA: Insufficient documentation

## 2017-04-20 DIAGNOSIS — R112 Nausea with vomiting, unspecified: Secondary | ICD-10-CM | POA: Insufficient documentation

## 2017-04-20 LAB — CBC
HEMATOCRIT: 48.3 % (ref 39.0–52.0)
HEMOGLOBIN: 16.9 g/dL (ref 13.0–17.0)
MCH: 29.9 pg (ref 26.0–34.0)
MCHC: 35 g/dL (ref 30.0–36.0)
MCV: 85.5 fL (ref 78.0–100.0)
Platelets: 208 10*3/uL (ref 150–400)
RBC: 5.65 MIL/uL (ref 4.22–5.81)
RDW: 12.3 % (ref 11.5–15.5)
WBC: 9.1 10*3/uL (ref 4.0–10.5)

## 2017-04-20 LAB — COMPREHENSIVE METABOLIC PANEL
ALT: 23 U/L (ref 17–63)
ANION GAP: 11 (ref 5–15)
AST: 26 U/L (ref 15–41)
Albumin: 4.8 g/dL (ref 3.5–5.0)
Alkaline Phosphatase: 71 U/L (ref 38–126)
BILIRUBIN TOTAL: 0.6 mg/dL (ref 0.3–1.2)
BUN: 11 mg/dL (ref 6–20)
CO2: 21 mmol/L — ABNORMAL LOW (ref 22–32)
Calcium: 9.3 mg/dL (ref 8.9–10.3)
Chloride: 102 mmol/L (ref 101–111)
Creatinine, Ser: 1.12 mg/dL (ref 0.61–1.24)
Glucose, Bld: 91 mg/dL (ref 65–99)
POTASSIUM: 3.8 mmol/L (ref 3.5–5.1)
Sodium: 134 mmol/L — ABNORMAL LOW (ref 135–145)
TOTAL PROTEIN: 8 g/dL (ref 6.5–8.1)

## 2017-04-20 LAB — LIPASE, BLOOD: LIPASE: 23 U/L (ref 11–51)

## 2017-04-20 LAB — URINALYSIS, ROUTINE W REFLEX MICROSCOPIC
BACTERIA UA: NONE SEEN
BILIRUBIN URINE: NEGATIVE
Glucose, UA: NEGATIVE mg/dL
HGB URINE DIPSTICK: NEGATIVE
Ketones, ur: 20 mg/dL — AB
Leukocytes, UA: NEGATIVE
NITRITE: NEGATIVE
PROTEIN: 30 mg/dL — AB
Specific Gravity, Urine: 1.031 — ABNORMAL HIGH (ref 1.005–1.030)
Squamous Epithelial / LPF: NONE SEEN
pH: 5 (ref 5.0–8.0)

## 2017-04-20 MED ORDER — IOPAMIDOL (ISOVUE-300) INJECTION 61%
INTRAVENOUS | Status: AC
  Administered 2017-04-20: 100 mL
  Filled 2017-04-20: qty 100

## 2017-04-20 MED ORDER — SODIUM CHLORIDE 0.9 % IV SOLN
INTRAVENOUS | Status: AC
  Administered 2017-04-20: 22:00:00 via INTRAVENOUS

## 2017-04-20 MED ORDER — ONDANSETRON 4 MG PO TBDP: ORAL_TABLET | ORAL | 0 refills | Status: DC

## 2017-04-20 MED ORDER — ONDANSETRON HCL 4 MG/2ML IJ SOLN
4.0000 mg | Freq: Once | INTRAMUSCULAR | Status: AC
  Administered 2017-04-20: 4 mg via INTRAVENOUS
  Filled 2017-04-20: qty 2

## 2017-04-20 MED ORDER — LOPERAMIDE HCL 2 MG PO CAPS: 2.0000 mg | ORAL_CAPSULE | Freq: Four times a day (QID) | ORAL | 0 refills | Status: DC | PRN

## 2017-04-20 MED ORDER — DICYCLOMINE HCL 20 MG PO TABS: 20.0000 mg | ORAL_TABLET | Freq: Two times a day (BID) | ORAL | 0 refills | Status: DC

## 2017-04-20 NOTE — ED Triage Notes (Signed)
Pt reports mid abd pain today that is intermittent and reports nausea and diarrhea.

## 2017-04-20 NOTE — ED Provider Notes (Signed)
[facility]-EMERGENCY DEPT Provider Note   CSN: [id number] Arrival date & time: 04/20/17  1539  By signing my name below, I, Deland Pretty, attest that this documentation has been prepared under the direction and in the presence of Kerrie Buffalo, NP Electronically Signed: Deland Pretty, ED Scribe. 04/20/17. 8:17 PM.   History   Chief Complaint Chief Complaint  Patient presents with  . Abdominal Pain  . Diarrhea     The history is provided by a relative. A language interpreter was used.  Abdominal Pain   This is a new problem. The current episode started 12 to 24 hours ago. The problem occurs constantly. The problem has been gradually worsening. The pain is associated with eating. The pain is located in the RLQ and LLQ. The pain is at a severity of 10/10. The pain is severe. Associated symptoms include fever (subjective), diarrhea, nausea, vomiting and headaches. Pertinent negatives include dysuria, frequency and hematuria. The symptoms are aggravated by eating. Nothing relieves the symptoms. Past workup does not include surgery.    HPI Comments: Albert Welch is a 31 y.o. male othwerwise healthy who presents to the Emergency Department complaining of worsening diarrhea with an onset of 5:00am today. Pt has associated lower abdominal pain, nausea, vomiting, head ache, and subjective fever and chills. Pt describes the pain as constant, gradually worsening and 10/10. Pt also reports bright red blood in his stools for 3 days and difficulty urinating, described as "feeling like he has to push to pee". Pt states eating exacerbates his symptoms. He is not tolerating food. Per relative, pt has tried [employer] for nausea and diarrhea with no relief. Pt does not have a h/o of appendectomy.  Pt denies ear pain, dysuria, frequency, urgency, hematuria, sore throat.   Past Medical History:  Diagnosis Date  . Allergy   . GERD (gastroesophageal reflux disease)   . H. pylori infection   .  Seasonal allergies     Patient Active Problem List   Diagnosis Date Noted  . Anxiety state, unspecified 05/22/2013  . GERD (gastroesophageal reflux disease) 05/22/2013  . Constipation 05/22/2013  . Seasonal allergies 12/20/2012    Past Surgical History:  Procedure Laterality Date  . none       Home Medications    Prior to Admission medications   Medication Sig Start Date End Date Taking? Authorizing Provider  meclizine (ANTIVERT) 12.5 MG tablet Take 1 tablet (12.5 mg total) by mouth 3 (three) times daily as needed for dizziness. 01/16/17   Dorena Bodo, NP  morphine (MSIR) 15 MG tablet Take 15 mg by mouth every 6 (six) hours as needed for severe pain.    Historical Provider, MD    Family History Family History  Problem Relation Age of Onset  . Diabetes Mother   . Kidney disease Mother     as a child  . Colon cancer Neg Hx   . Esophageal cancer Neg Hx   . Rectal cancer Neg Hx   . Stomach cancer Neg Hx     Social History Social History  Substance Use Topics  . Smoking status: Former Smoker    Quit date: 05/25/2010  . Smokeless tobacco: Never Used  . Alcohol use Yes     Comment: rare     Allergies   Patient has no known allergies.   Review of Systems Review of Systems  Constitutional: Positive for chills and fever (subjective).  HENT: Negative for ear pain and sore throat.   Respiratory: Negative for cough.  Cardiovascular: Negative for chest pain.  Gastrointestinal: Positive for abdominal pain, blood in stool, diarrhea, nausea and vomiting.  Genitourinary: Positive for difficulty urinating. Negative for dysuria, frequency, hematuria and urgency.  Musculoskeletal: Negative for gait problem and neck stiffness.  Skin: Negative for wound.  Neurological: Positive for headaches. Negative for syncope.  Psychiatric/Behavioral: The patient is not nervous/anxious.      Physical Exam Updated Vital Signs BP 114/60 (BP Location: Right Arm)   Pulse 88   Temp  98.7 F (37.1 C) (Oral)   Resp 16   SpO2 98%   Physical Exam  Constitutional: He appears well-developed and well-nourished. No distress.  Appears  Very uncomfortable   HENT:  Head: Normocephalic and atraumatic.  Eyes: Conjunctivae are normal.  Neck: Normal range of motion. Neck supple.  Cardiovascular: Regular rhythm.  Tachycardia present.   Pulmonary/Chest: Effort normal and breath sounds normal.  Abdominal: Soft. Bowel sounds are normal. There is tenderness. There is no rebound and no guarding.  RLQ tenderness  Musculoskeletal: Normal range of motion. He exhibits no edema.  Neurological: He is alert.  Skin: Skin is warm and dry.  Psychiatric: He has a normal mood and affect. His behavior is normal.  Nursing note and vitals reviewed.    ED Treatments / Results  DIAGNOSTIC STUDIES: Oxygen Saturation is 98% on RA, normal by my interpretation.   COORDINATION OF CARE: 8:17 PM-Discussed next steps with pt which includes IVF, antiemetic. Pt verbalized understanding and is agreeable with the plan.   Labs (all labs ordered are listed, but only abnormal results are displayed) Labs Reviewed  COMPREHENSIVE METABOLIC PANEL - Abnormal; Notable for the following:       Result Value   Sodium 134 (*)    CO2 21 (*)    All other components within normal limits  URINALYSIS, ROUTINE W REFLEX MICROSCOPIC - Abnormal; Notable for the following:    Specific Gravity, Urine 1.031 (*)    Ketones, ur 20 (*)    Protein, ur 30 (*)    All other components within normal limits  LIPASE, BLOOD  CBC   Radiology No results found.  Procedures Procedures (including critical care time) IV fluids, Zofran, labs, CT of abdomen and pelvis.   Medications Ordered in ED Medications  0.9 %  sodium chloride infusion (not administered)  ondansetron (ZOFRAN) injection 4 mg (not administered)     Initial Impression / Assessment and Plan / ED Course  I have reviewed the triage vital signs and the  nursing notes. 31 y.o. male with abdominal pain awaiting CT. Care turned over to Felicie Morn, NP @ 2115.   New Prescriptions New Prescriptions   No medications on file  I personally performed the services described in this documentation, which was scribed in my presence. The recorded information has been reviewed and is accurate.     [place], [place] 04/20/17 2118    Mancel Bale, MD 04/21/17 [phone]

## 2017-04-20 NOTE — ED Notes (Signed)
Pt reports waking up at 5am with severe lower abd pain, intermittent, sharp. With diarrhea +25x, emesis x 1. Pt report gross blood in BM's, pt states this is a frequent issue. Pt had full GI work up last year for same.

## 2017-04-20 NOTE — ED Notes (Signed)
Interpretor ipad used for d/c instructions. Pt verbalized understanding

## 2017-09-26 IMAGING — CT CT ABD-PELV W/ CM
2 of 4 series · 16 of 46 positions shown, 18 images · IV contrast (agent unspecified)
Comparison: 06/30/2016

CLINICAL DATA: Umbilical and lower abdominal pain with nausea and
vomiting

EXAM:
CT ABDOMEN AND PELVIS WITH CONTRAST
TECHNIQUE: Multidetector CT imaging of the abdomen and pelvis was performed
using the standard protocol following bolus administration of
intravenous contrast.
CONTRAST:  100 mL Wsovue-QSS intravenous

[Series 3: abd/ pelvis 5.0 i30f 2 · axial · 0.76mm/px · z∈[-569,-139]mm · 13 of 94 slices shown, 15 images]
[im 4/94  soft-tissue]
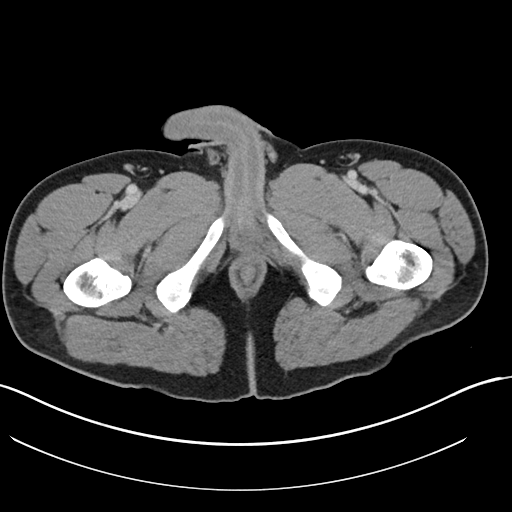
[im 4/94  bone]
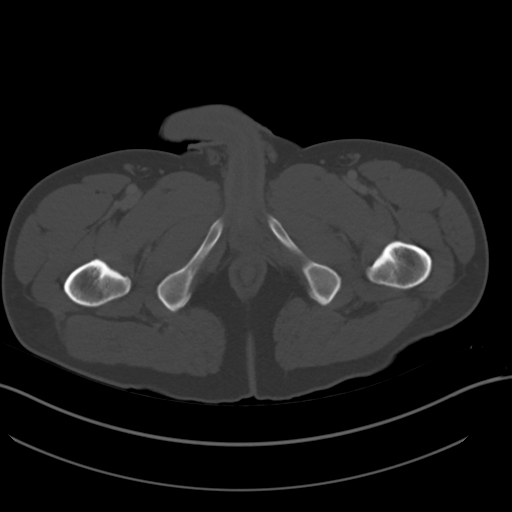
[im 12/94  soft-tissue]
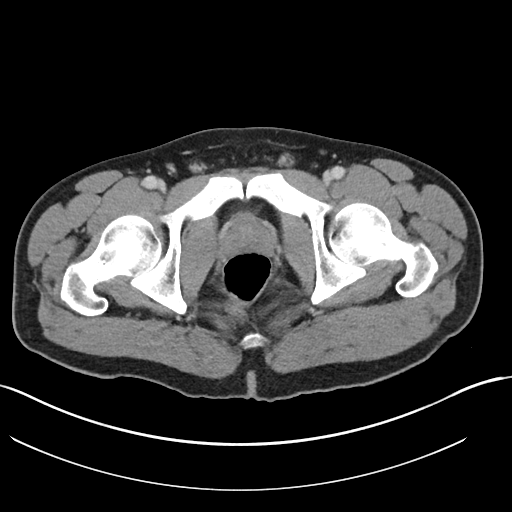
[im 19/94  soft-tissue]
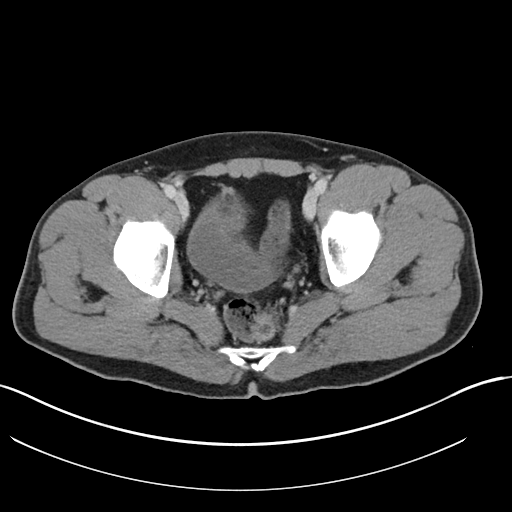
[im 27/94  soft-tissue]
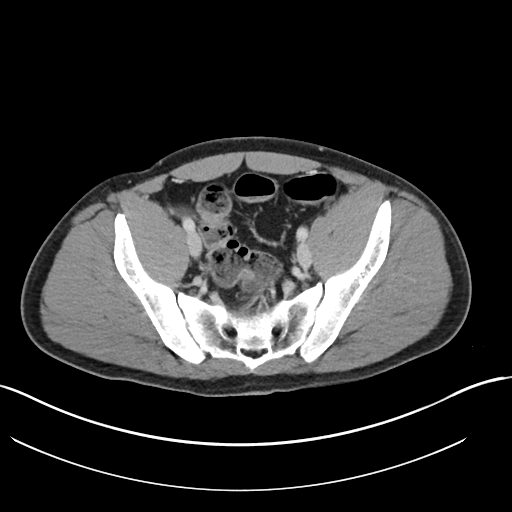
[im 34/94  soft-tissue]
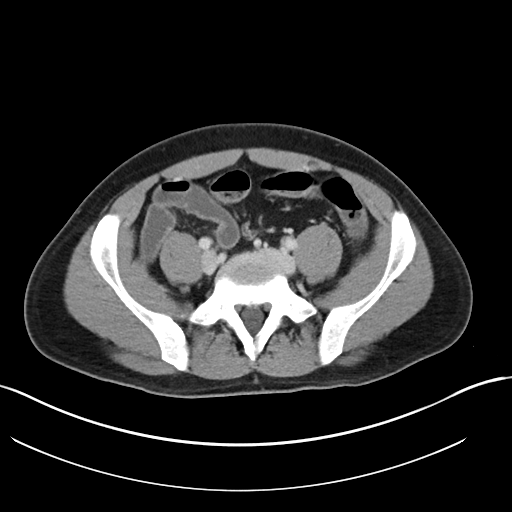
[im 41/94  soft-tissue]
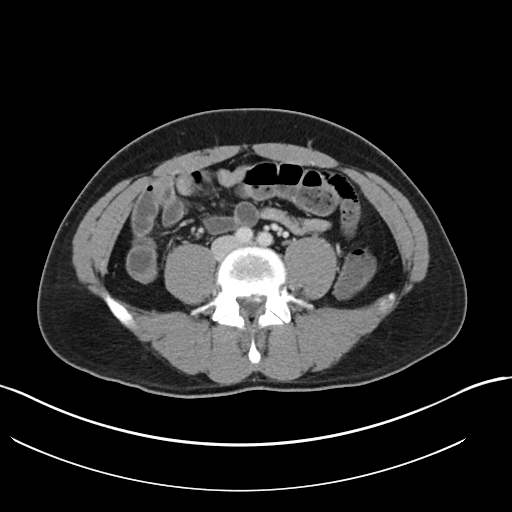
[im 49/94  soft-tissue]
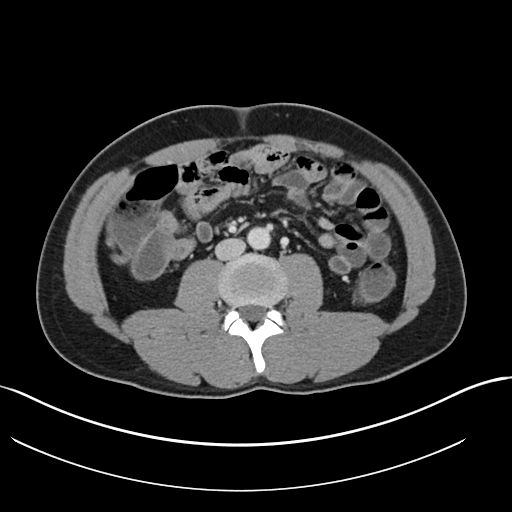
[im 53/94  soft-tissue]
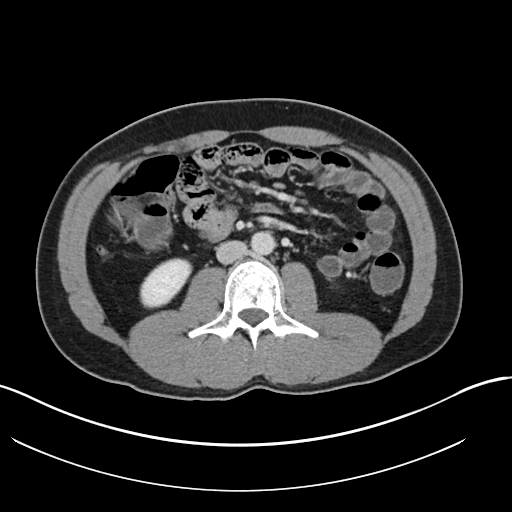
[im 60/94  soft-tissue]
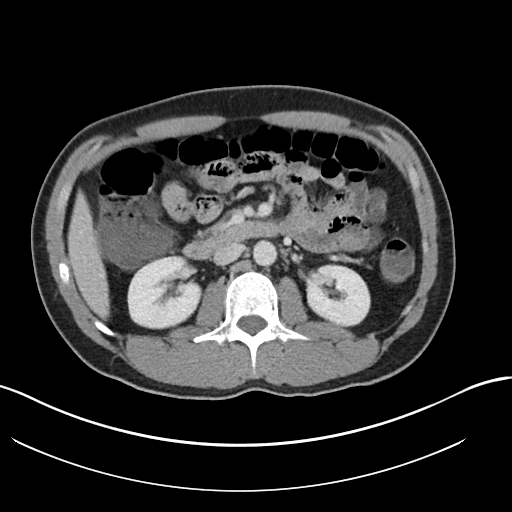
[im 60/94  bone]
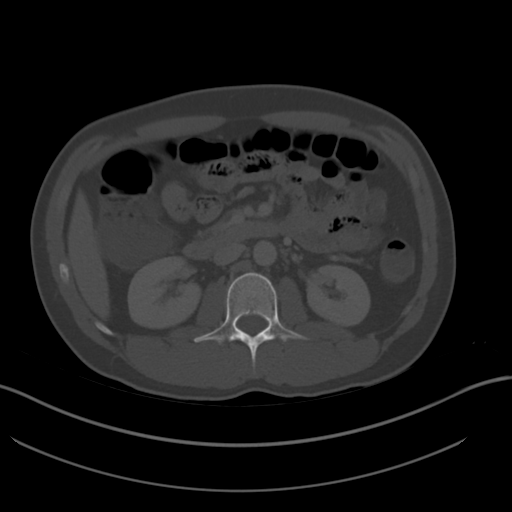
[im 67/94  soft-tissue]
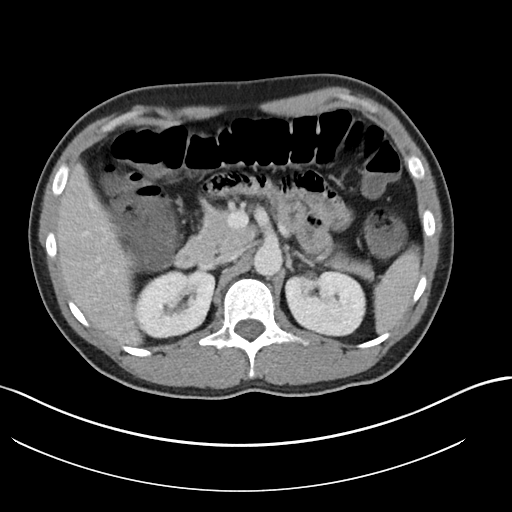
[im 75/94  soft-tissue]
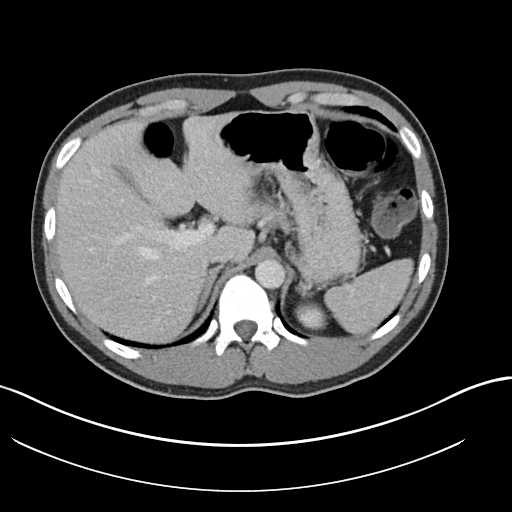
[im 82/94  soft-tissue]
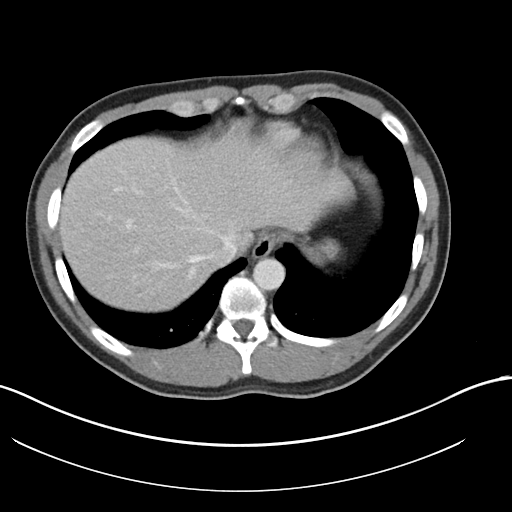
[im 90/94  soft-tissue]
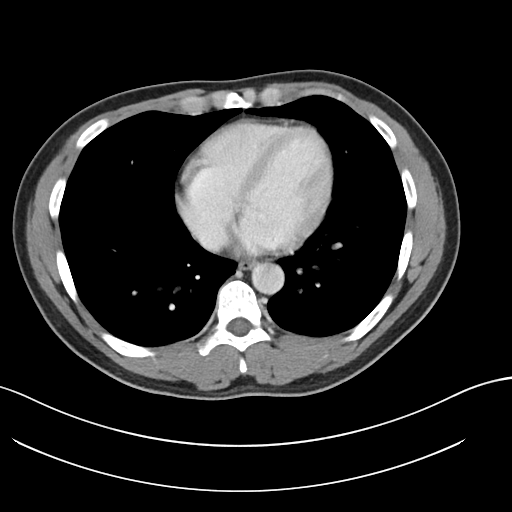

[Series 6: coronal soft tissue · coronal · 0.67mm/px · 3 of 76 slices shown]
[im 26/76  soft-tissue]
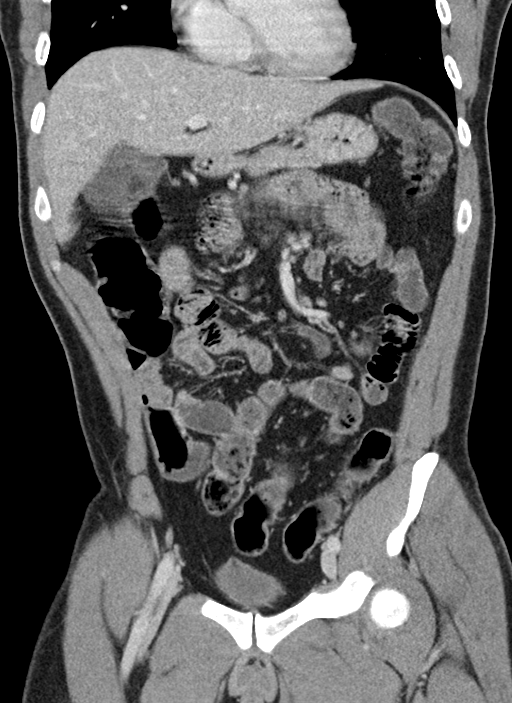
[im 34/76  soft-tissue]
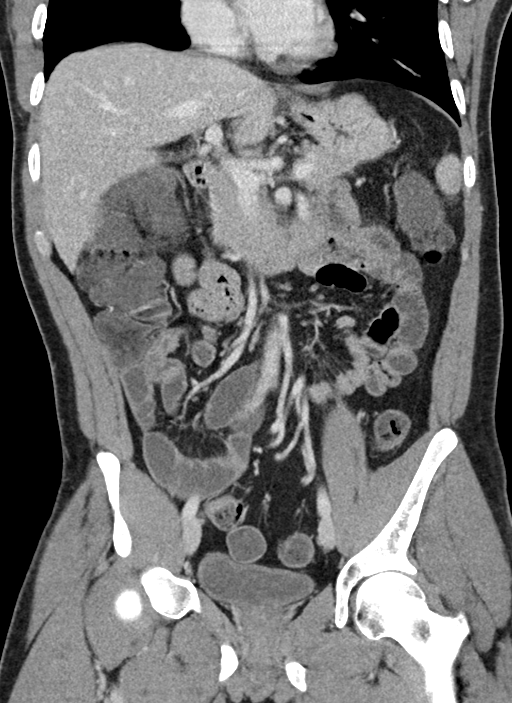
[im 42/76  soft-tissue]
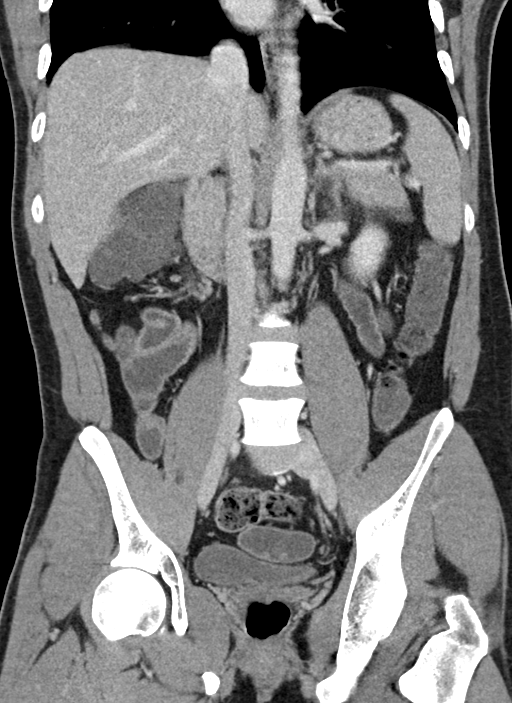

[16 of 46 positions shown; findings below may reference images not displayed]

FINDINGS: Lower chest: Lung bases demonstrate no acute consolidation or
pleural effusion. Normal heart size.

Hepatobiliary: No focal liver abnormality is seen. No gallstones,
gallbladder wall thickening, or biliary dilatation.

Pancreas: Unremarkable. No pancreatic ductal dilatation or
surrounding inflammatory changes.

Spleen: Normal in size without focal abnormality.

Adrenals/Urinary Tract: Adrenal glands are unremarkable. Kidneys are
normal, without renal calculi, focal lesion, or hydronephrosis.
Bladder is unremarkable.

Stomach/Bowel: Stomach is within normal limits. Appendix appears
normal. No evidence of bowel wall thickening, distention, or
inflammatory changes. Some fluid-filled small bowel and right colon
but no evidence for obstruction.

Vascular/Lymphatic: No significant vascular findings are present. No
enlarged abdominal or pelvic lymph nodes.

Reproductive: Prostate is unremarkable.

Other: No abdominal wall hernia or abnormality. No abdominopelvic
ascites.

Musculoskeletal: No acute or significant osseous findings.
IMPRESSION: Negative for acute intra-abdominal or pelvic pathology. Normal
appendix.

## 2018-04-29 ENCOUNTER — Encounter (INDEPENDENT_AMBULATORY_CARE_PROVIDER_SITE_OTHER): Payer: Self-pay

## 2018-04-29 ENCOUNTER — Ambulatory Visit: Payer: Self-pay | Admitting: Gastroenterology

## 2018-04-29 ENCOUNTER — Encounter: Payer: Self-pay | Admitting: Gastroenterology

## 2018-04-29 VITALS — BP 94/80 | HR 72 | Ht 67.0 in | Wt 170.5 lb

## 2018-04-29 DIAGNOSIS — K625 Hemorrhage of anus and rectum: Secondary | ICD-10-CM

## 2018-04-29 DIAGNOSIS — K5900 Constipation, unspecified: Secondary | ICD-10-CM

## 2018-04-29 NOTE — Progress Notes (Signed)
[facility] Progress Note  Chief Complaint: Constipation and rectal bleeding  Subjective  History:  This is a 32 year old man I last saw in September 2017 for chronic constipation and rectal bleeding.  He had previously seen Dr. Arlyce Dice in 2015 in 2016 for dyspepsia, H. pylori he was treated (f/u stool Ag neg), and Dr. Arlyce Dice suspected there may be an anxiety component to the symptoms.  Colonoscopy 09/04/2016 was normal. He was seen in the ED 04/20/2017 for acute onset lower abdominal pain and diarrhea.  CBC and CMP normal.  CT abdomen and pelvis showed nonspecific finding of some fluid-filled and nondilated small bowel and right colon.  Jlon was here with his wife and children today and seen with the aid of a Spanish interpreter.  He reports ongoing problems with constipation where he might not have a BM for several days, then he will have to strain and might have some bleeding and anorectal pain.  He reports getting some kind of a cream and suppository for "hemorrhoids" from primary care and says that when he used either of these he had no problems.  He was able to move his bowels well without pain or bleeding.  He denies nausea vomiting dysphagia or weight loss.  ROS: Cardiovascular:  no chest pain Respiratory: no dyspnea  The patient's Past Medical, Family and Social History were reviewed and are on file in the EMR.  Objective:  No current chronic medicines  Vital signs in last 24 hrs: Vitals:   04/29/18 1604  BP: 94/80  Pulse: 72    Physical Exam    HEENT: sclera anicteric, oral mucosa moist without lesions  Neck: supple, no thyromegaly, JVD or lymphadenopathy  Cardiac: RRR without murmurs, S1S2 heard, no peripheral edema  Pulm: clear to auscultation bilaterally, normal RR and effort noted  Abdomen: soft, no tenderness, with active bowel sounds. No guarding or palpable hepatosplenomegaly.  Skin; warm and dry, no jaundice or rash Rectal:  Anal spasm, no  fissure or tenderness, no palpable internal findings  Recent Labs:  CBC Latest Ref Rng & Units 04/20/2017 06/29/2016 06/19/2009  WBC 4.0 - 10.5 K/uL 9.1 8.4 9.6  Hemoglobin 13.0 - 17.0 g/dL 16.1 09.6 04.5  Hematocrit 39.0 - 52.0 % 48.3 46.8 43.8  Platelets 150 - 400 K/uL 208 239 184   CMP Latest Ref Rng & Units 04/20/2017 06/29/2016 06/19/2009  Glucose 65 - 99 mg/dL 91 84 90  BUN 6 - 20 mg/dL Creatinine 0.61 - 1.24 mg/dL 4.09 8.11 9.14  Sodium 135 - 145 mmol/L 134(L) 138 140  Potassium 3.5 - 5.1 mmol/L 3.8 3.4(L) 3.8  Chloride 101 - 111 mmol/L 102 104 107  CO2 22 - 32 mmol/L 21(L) 28 29  Calcium 8.9 - 10.3 mg/dL 9.3 9.9 9.3  Total Protein 6.5 - 8.1 g/dL 8.0 7.6 -  Total Bilirubin 0.3 - 1.2 mg/dL 0.6 0.3 -  Alkaline Phos 38 - 126 U/L 71 88 -  AST 15 - 41 U/L 26 37 -  ALT 17 - 63 U/L 23 51 -    @ Assessment: Encounter Diagnoses  Name Primary?  . Constipation, unspecified constipation type Yes  . Anal bleeding    Chronic slow transit constipation, and I suspect there may be an element of anal spasm or dyssynergia.  He has significant anxiety about the nature of the symptoms, but I have reassured him that he had a normal colonoscopy just over 18 months ago, no worrisome findings  on exam today, he does not appear to have hemorrhoids.  I think he is having benign anal bleeding during episodes of constipation.   Plan:  MiraLAX daily As needed use of glycerin suppository if no BM for 2 or 3 days No further testing needed at this time.  See me as needed  Total time 25 minutes, over half spent face-to-face with patient in counseling and coordination of care.  Extra time needed for use of interpreter. Charlie Pitter III

## 2018-04-29 NOTE — Patient Instructions (Addendum)
Miralax powder - one half to one full capful in water once daily.  Use a glycerine suppository as needed if no BM for 3 days.  Available over the counter.  Thank you for choosing [facility]  Dr Amada Jupiter III   Miralax en polvo: Neomia Dear mitad a una tapa llena en agua una vez al da.  Use un supositorio de glicerina segn sea necesario si no tiene BM durante 3 das. Disponible en el mostrador.  Karl Pock por elegir  GI  Dr. Amada Jupiter II

## 2020-01-17 ENCOUNTER — Other Ambulatory Visit: Payer: Self-pay

## 2020-01-17 ENCOUNTER — Encounter (HOSPITAL_COMMUNITY): Payer: Self-pay | Admitting: Emergency Medicine

## 2020-01-17 ENCOUNTER — Emergency Department (HOSPITAL_COMMUNITY)
Admission: EM | Admit: 2020-01-17 | Discharge: 2020-01-17 | Disposition: A | Discharge status: Home / Self Care | Payer: Self-pay | Attending: Emergency Medicine | Admitting: Emergency Medicine

## 2020-01-17 DIAGNOSIS — M436 Torticollis: Secondary | ICD-10-CM | POA: Insufficient documentation

## 2020-01-17 DIAGNOSIS — Z87891 Personal history of nicotine dependence: Secondary | ICD-10-CM | POA: Insufficient documentation

## 2020-01-17 MED ORDER — OXYCODONE-ACETAMINOPHEN 5-325 MG PO TABS
1.0000 | ORAL_TABLET | Freq: Once | ORAL | Status: AC
  Administered 2020-01-17: 1 via ORAL
  Filled 2020-01-17: qty 1

## 2020-01-17 MED ORDER — METHOCARBAMOL 500 MG PO TABS: 500.0000 mg | ORAL_TABLET | Freq: Two times a day (BID) | ORAL | 0 refills | Status: DC

## 2020-01-17 MED ORDER — METHOCARBAMOL 500 MG PO TABS
500.0000 mg | ORAL_TABLET | Freq: Once | ORAL | Status: AC
  Administered 2020-01-17: 500 mg via ORAL
  Filled 2020-01-17: qty 1

## 2020-01-17 MED ORDER — NAPROXEN 500 MG PO TABS: 500.0000 mg | ORAL_TABLET | Freq: Two times a day (BID) | ORAL | 0 refills | Status: DC

## 2020-01-17 MED ORDER — ACETAMINOPHEN 325 MG PO TABS
650.0000 mg | ORAL_TABLET | Freq: Once | ORAL | Status: AC
Start: 2020-01-17 — End: 2020-01-17
  Administered 2020-01-17: 650 mg via ORAL
  Filled 2020-01-17: qty 2

## 2020-01-17 MED ORDER — KETOROLAC TROMETHAMINE 30 MG/ML IJ SOLN
30.0000 mg | Freq: Once | INTRAMUSCULAR | Status: AC
  Administered 2020-01-17: 30 mg via INTRAMUSCULAR
  Filled 2020-01-17: qty 1

## 2020-01-17 NOTE — ED Provider Notes (Signed)
[facility] EMERGENCY DEPARTMENT Provider Note   CSN: [id number] Arrival date & time: 01/17/20  1610     History Chief Complaint  Patient presents with  . Neck Pain    Albert Welch is a 34 y.o. male with a past medical history of anxiety, GERD presenting to the ED with a chief complaint of right-sided neck pain.  Reports aching right-sided neck pain that is worse with movement and palpation since he woke up this morning.  Reports minimal improvement with massage and heat therapy by his wife.  He denies history of similar symptoms in the past.  Denies any fever, headache, vision changes, prior neck surgeries, neck stiffness, trauma, numbness or weakness of extremities.  HPI     Past Medical History:  Diagnosis Date  . Allergy   . GERD (gastroesophageal reflux disease)   . H. pylori infection   . Seasonal allergies     Patient Active Problem List   Diagnosis Date Noted  . Anxiety state, unspecified 05/22/2013  . GERD (gastroesophageal reflux disease) 05/22/2013  . Constipation 05/22/2013  . Seasonal allergies 12/20/2012    Past Surgical History:  Procedure Laterality Date  . none         Family History  Problem Relation Age of Onset  . Diabetes Mother   . Kidney disease Mother        as a child  . Colon cancer Neg Hx   . Esophageal cancer Neg Hx   . Rectal cancer Neg Hx   . Stomach cancer Neg Hx     Social History   Tobacco Use  . Smoking status: Former Smoker    Quit date: 05/25/2010    Years since quitting: 9.6  . Smokeless tobacco: Never Used  Substance Use Topics  . Alcohol use: Yes    Comment: rare  . Drug use: No    Home Medications Prior to Admission medications   Medication Sig Start Date End Date Taking? Authorizing Provider  methocarbamol (ROBAXIN) 500 MG tablet Take 1 tablet (500 mg total) by mouth 2 (two) times daily. 01/17/20   Emad Brechtel, PA-C  naproxen (NAPROSYN) 500 MG tablet Take 1 tablet (500 mg total) by  mouth 2 (two) times daily. 01/17/20   Dietrich Pates, PA-C    Allergies    Patient has no known allergies.  Review of Systems   Review of Systems  Constitutional: Negative for chills and fever.  Musculoskeletal: Positive for myalgias and neck pain.  Neurological: Negative for weakness, numbness and headaches.    Physical Exam Updated Vital Signs BP (!) 127/93 (BP Location: Left Arm)   Pulse 78   Temp 99 F (37.2 C) (Oral)   Resp 17   SpO2 99%   Physical Exam Vitals and nursing note reviewed.  Constitutional:      General: He is not in acute distress.    Appearance: He is well-developed. He is not diaphoretic.  HENT:     Head: Normocephalic and atraumatic.  Eyes:     General: No scleral icterus.    Conjunctiva/sclera: Conjunctivae normal.  Neck:      Comments: Tenderness palpation of the right paraspinal musculature without midline tenderness of the cervical spine. Pain with ROM although able to perform. No meningeal signs noted. Equal grip strength bilaterally.  Sensation intact to light touch of bilateral upper extremities. Pulmonary:     Effort: Pulmonary effort is normal. No respiratory distress.  Musculoskeletal:     Cervical back: Normal range  of motion. No rigidity. Muscular tenderness present. No spinous process tenderness. Normal range of motion.  Skin:    Findings: No rash.  Neurological:     Mental Status: He is alert.     ED Results / Procedures / Treatments   Labs (all labs ordered are listed, but only abnormal results are displayed) Labs Reviewed - No data to display  EKG None  Radiology No results found.  Procedures Procedures (including critical care time)  Medications Ordered in ED Medications  oxyCODONE-acetaminophen (PERCOCET/ROXICET) 5-325 MG per tablet 1 tablet (1 tablet Oral Given 01/17/20 1712)  ketorolac (TORADOL) 30 MG/ML injection 30 mg (30 mg Intramuscular Given 01/17/20 1713)  methocarbamol (ROBAXIN) tablet 500 mg (500 mg Oral  Given 01/17/20 1712)  acetaminophen (TYLENOL) tablet 650 mg (650 mg Oral Given 01/17/20 1712)    ED Course  I have reviewed the triage vital signs and the nursing notes.  Pertinent labs & imaging results that were available during my care of the patient were reviewed by me and considered in my medical decision making (see chart for details).    MDM Rules/Calculators/A&P                      34 year old male presenting to the ED with a chief complaint of right-sided neck pain.  Symptoms began when he woke up this morning.  He has had minimal improvement with NSAIDs and massaging at home.  He denies any trauma, injuries, numbness in arms or legs, neck stiffness, fever, headache or blurry vision.  There is tenderness palpation of the right paraspinal musculature on my exam but no meningeal signs.  He has normal strength and sensation in his upper extremities.  He does not appear toxic, he is afebrile.  Patient with significant improvement with medications given here including muscle relaxer, anti-inflammatories.  On recheck he is resting comfortably, improvement in his neck movement.  I suspect that his symptoms are musculoskeletal in nature and I doubt infectious or vascular cause of his symptoms today.  He is comfortable with discharge home with muscle relaxer and anti-inflammatories and PCP follow-up.  Patient is hemodynamically stable, in NAD, and able to ambulate in the ED. Evaluation does not show pathology that would require ongoing emergent intervention or inpatient treatment. I explained the diagnosis to the patient. Pain has been managed and has no complaints prior to discharge. Patient is comfortable with above plan and is stable for discharge at this time. All questions were answered prior to disposition. Strict return precautions for returning to the ED were discussed. Encouraged follow up with PCP.   An After Visit Summary was printed and given to the patient.   Portions of this note  were generated with [employer]. Dictation errors may occur despite best attempts at proofreading.  Final Clinical Impression(s) / ED Diagnoses Final diagnoses:  Torticollis, acute    Rx / DC Orders ED Discharge Orders         Ordered    methocarbamol (ROBAXIN) 500 MG tablet  2 times daily     01/17/20 1806    naproxen (NAPROSYN) 500 MG tablet  2 times daily     01/17/20 1806           Delia Heady, PA-C 01/17/20 1810    Lajean Saver, MD 01/17/20 2243

## 2020-01-17 NOTE — Discharge Instructions (Signed)
Take the medications as needed as prescribed. You can use a heating pad, stretching and massaging the area will be helpful as well. Return to the ED if you start to develop worsening symptoms, neck stiffness, fever, headache or blurry vision, chest pain.

## 2020-01-17 NOTE — ED Triage Notes (Signed)
C/o R sided neck pain since waking up this morning.

## 2020-09-30 ENCOUNTER — Ambulatory Visit: Payer: Self-pay | Admitting: Gastroenterology

## 2020-10-22 ENCOUNTER — Encounter: Payer: Self-pay | Admitting: Nurse Practitioner

## 2020-10-22 ENCOUNTER — Ambulatory Visit (INDEPENDENT_AMBULATORY_CARE_PROVIDER_SITE_OTHER): Payer: Self-pay | Admitting: Nurse Practitioner

## 2020-10-22 ENCOUNTER — Other Ambulatory Visit (INDEPENDENT_AMBULATORY_CARE_PROVIDER_SITE_OTHER): Payer: Self-pay

## 2020-10-22 VITALS — BP 110/70 | HR 80 | Ht 67.0 in | Wt 185.0 lb

## 2020-10-22 DIAGNOSIS — Z8619 Personal history of other infectious and parasitic diseases: Secondary | ICD-10-CM

## 2020-10-22 DIAGNOSIS — R1033 Periumbilical pain: Secondary | ICD-10-CM

## 2020-10-22 DIAGNOSIS — K219 Gastro-esophageal reflux disease without esophagitis: Secondary | ICD-10-CM

## 2020-10-22 LAB — COMPREHENSIVE METABOLIC PANEL
ALT: 25 U/L (ref 0–53)
AST: 20 U/L (ref 0–37)
Albumin: 4.7 g/dL (ref 3.5–5.2)
Alkaline Phosphatase: 77 U/L (ref 39–117)
BUN: 7 mg/dL (ref 6–23)
CO2: 29 mEq/L (ref 19–32)
Calcium: 9.6 mg/dL (ref 8.4–10.5)
Chloride: 102 mEq/L (ref 96–112)
Creatinine, Ser: 1.02 mg/dL (ref 0.40–1.50)
GFR: 95.7 mL/min (ref >60.00)
Glucose, Bld: 81 mg/dL (ref 70–99)
Potassium: 3.9 mEq/L (ref 3.5–5.1)
Sodium: 138 mEq/L (ref 135–145)
Total Bilirubin: 0.4 mg/dL (ref 0.2–1.2)
Total Protein: 7.9 g/dL (ref 6.0–8.3)

## 2020-10-22 LAB — CBC WITH DIFFERENTIAL/PLATELET
Basophils Absolute: 0.1 10*3/uL (ref 0.0–0.1)
Basophils Relative: 1.1 % (ref 0.0–3.0)
Eosinophils Absolute: 0.3 10*3/uL (ref 0.0–0.7)
Eosinophils Relative: 2.8 % (ref 0.0–5.0)
HCT: 45.8 % (ref 39.0–52.0)
Hemoglobin: 15.5 g/dL (ref 13.0–17.0)
Lymphocytes Relative: 30.5 % (ref 12.0–46.0)
Lymphs Abs: 3 10*3/uL (ref 0.7–4.0)
MCHC: 33.9 g/dL (ref 30.0–36.0)
MCV: 85.2 fl (ref 78.0–100.0)
Monocytes Absolute: 0.7 10*3/uL (ref 0.1–1.0)
Monocytes Relative: 7.5 % (ref 3.0–12.0)
Neutro Abs: 5.6 10*3/uL (ref 1.4–7.7)
Neutrophils Relative %: 58.1 % (ref 43.0–77.0)
Platelets: 239 10*3/uL (ref 150.0–400.0)
RBC: 5.37 Mil/uL (ref 4.22–5.81)
RDW: 13.8 % (ref 11.5–15.5)
WBC: 9.7 10*3/uL (ref 4.0–10.5)

## 2020-10-22 LAB — C-REACTIVE PROTEIN: CRP: <1 mg/dL (ref 0.5–20.0)

## 2020-10-22 MED ORDER — FAMOTIDINE 20 MG PO TABS: 20.0000 mg | ORAL_TABLET | Freq: Every day | ORAL | 1 refills | Status: DC

## 2020-10-22 MED ORDER — DICYCLOMINE HCL 10 MG PO CAPS: 10.0000 mg | ORAL_CAPSULE | Freq: Three times a day (TID) | ORAL | 0 refills | Status: DC | PRN

## 2020-10-22 NOTE — Patient Instructions (Signed)
If you are age 34 or older, your body mass index should be between 23-30. Your Body mass index is 28.98 kg/m. If this is out of the aforementioned range listed, please consider follow up with your Primary Care Provider.  If you are age 63 or younger, your body mass index should be between 19-25. Your Body mass index is 28.98 kg/m. If this is out of the aformentioned range listed, please consider follow up with your Primary Care Provider.   Your provider has requested that you go to the basement level for lab work before leaving today. Press "B" on the elevator. The lab is located at the first door on the left as you exit the elevator.   START Famotidine 20 mg 1 tablet daily. START Dicyclomine 10 mg 1 tablet by mouth three times a day as needed for abdominal pain. Both have been sent to your pharmacy.  You have been scheduled to follow up with Dr. Myrtie Neither on December 14, 2020 at 3:00 pm

## 2020-10-22 NOTE — Progress Notes (Signed)
10/22/2020 Albert Welch [id number] [birth year]   Chief Complaint: Abdominal pain, acid reflux   History of Present Illness:  Albert Welch is a 34 year old male with past medical history GERD and H. Pylori. He presents with a [place] Spanish interpreter. He complains of having daily burning pain below the bellybutton which radiates up into his esophagus.  The symptoms are worse when he has an empty stomach.  He denies having any dysphagia.  He has infrequent nausea.  No vomiting.  No NSAID use.  No alcohol use.  He drinks 2 caffeinated sodas daily.  No coffee.  Citrus and spicy foods worsen his abdominal pain.  He is passing a normal brown bowel movement most days.  He infrequently sees a small amount of bright red blood on the toilet tissue if he has constipation.  He is anxious.  He wants "all of this checked out".  He is concerned about having cancer.  No fever, sweats or chills.  No weight loss.  No family history of esophageal, gastric or colon cancer.  He was initially evaluated in our office by Dr. Arlyce Dice in 2015.  He underwent an EGD which was positive for H. pylori gastritis and he was treated with a Prevpac and his symptoms improved.   He was evaluated by Dr. Myrtie Neither in 2017 due to having left lower quadrant abdominal pain, constipation and rectal bleeding.  He underwent a colonoscopy 09/04/2016 which was normal.   He was seen in the ED 04/20/2017 for acute onset umbilical and lower abdominal pain and diarrhea. CBC and CMP normal. CT abdomen and pelvis showed nonspecific finding of some fluid-filled and nondilated small bowel and right colon.  He was also  followed by gastroenterologist Dr. Quinn Axe at [facility].  He underwent an EGD 04/29/2015 at [facility], biopsies were negative for H. pylori.  He was last seen by Dr. Quinn Axe 06/03/2019 further evaluation regarding pain around his bellybutton area which moves up into his epigastrium which is the same pain he is  currently experiencing.  He underwent H. pylori stool antigen testing 08/07/2019 which was negative.    Medications: None  Allergies: No known drug allergies  Current Medications, Allergies, Past Medical History, Past Surgical History, Family History and Social History were reviewed in [employer] record.  Review of Systems:   Constitutional: Negative for fever, sweats, chills or weight loss.  Respiratory: Negative for shortness of breath.   Cardiovascular: Negative for chest pain, palpitations and leg swelling.  Gastrointestinal: See HPI.  Musculoskeletal: Negative for back pain or muscle aches.  Neurological: Negative for dizziness, headaches or paresthesias.    Physical Exam: BP 110/70   Pulse 80   Ht 5\' 7"  (1.702 m)   Wt 185 lb (83.9 kg)   SpO2 98%   BMI 28.98 kg/m  General: Well developed 34 year old male in no acute distress. Head: Normocephalic and atraumatic. Eyes: No scleral icterus. Conjunctiva pink . Ears: Normal auditory acuity. Mouth: Dentition intact. No ulcers or lesions.  Lungs: Clear throughout to auscultation. Heart: Regular rate and rhythm, no murmur. Abdomen: Soft, nontender and nondistended. No masses or hepatomegaly. Normal bowel sounds x 4 quadrants.  Rectal: Deferred.  Musculoskeletal: Symmetrical with no gross deformities. Extremities: No edema. Neurological: Alert oriented x 4. No focal deficits.  Psychological: Alert and cooperative. Normal mood and affect  Assessment and Recommendations:  68.  34 year old male with GERD. H. pylori gastritis in 2015 treated with  a Prevpak.  A repeat EGD in 2016 without evidence of H. Pylori.  H. pylori stool antigen 07/2019 was negative. -GERD handout, avoid citrus and spicy foods -Famotidine 20 mg daily  2.  Periumbilical pain which radiates into the esophagus, burning quality.  -CBC, CMP and CRP -Dicyclomine 10 mg 1 p.o. 3 times daily as needed abdominal pain -Consider CT enterography if  symptoms persist vs EGD/small bowel enteroscopy -Follow up in office with Dr. Myrtie Neither in 6 weeks -Patient to call our office if his symptoms worsen

## 2020-10-26 NOTE — Progress Notes (Signed)
____________________________________________________________  Attending physician addendum:  Thank you for sending this case to me. I have reviewed the entire note, and the outlined plan seems appropriate.  Sounds like he has had similar symptoms over the years.  I will re-evaluate when I see him.  Amada Jupiter, MD  ____________________________________________________________

## 2020-12-14 ENCOUNTER — Ambulatory Visit (INDEPENDENT_AMBULATORY_CARE_PROVIDER_SITE_OTHER): Payer: Self-pay | Admitting: Gastroenterology

## 2020-12-14 ENCOUNTER — Encounter: Payer: Self-pay | Admitting: Gastroenterology

## 2020-12-14 VITALS — BP 112/74 | HR 68 | Ht 67.0 in | Wt 183.0 lb

## 2020-12-14 DIAGNOSIS — K5909 Other constipation: Secondary | ICD-10-CM

## 2020-12-14 DIAGNOSIS — K625 Hemorrhage of anus and rectum: Secondary | ICD-10-CM

## 2020-12-14 DIAGNOSIS — K219 Gastro-esophageal reflux disease without esophagitis: Secondary | ICD-10-CM

## 2020-12-14 DIAGNOSIS — R1033 Periumbilical pain: Secondary | ICD-10-CM

## 2020-12-14 DIAGNOSIS — R140 Abdominal distension (gaseous): Secondary | ICD-10-CM

## 2020-12-14 NOTE — Progress Notes (Signed)
[facility] Progress Note  Chief Complaint: Abdominal pain  Subjective  History: Last seen by our NP on 10/22/2020 with chronic periumbilic to epigastric burning abdominal discomfort.  He has been seen for this as well as constipation with benign anal bleeding for least 5 years, originally by Dr. Arlyce Dice, then by me in 2017 he was then seen by a GI provider in [facility] in 2020.  Over the years he has had multiple endoscopic procedures and imaging studies as well as lab work and trials of different medicines.  I saw Kilo today along with his wife and a Spanish interpreter.  He has periumbilical to lower abdominal burning discomfort that he feels is similar to what occurred in the past but then it got better and returned a couple of months ago.  He has intermittent constipation and takes MiraLAX on average twice a week.  He has had some intermittent rectal bleeding and felt that there was a bulging or knot in the perianal area.  Dicyclomine did not help his abdominal pain.  Pepcid helps decreasing heartburn but he still has regurgitation.  He is concerned that something serious is going on with the symptoms.  I reviewed his extensive previous work-up, he knows that testing all turned out okay but is concerned that he could still develop cancer in the meantime.  ROS: Cardiovascular:  no chest pain Respiratory: no dyspnea A few months of intermittent urinary retention Remainder of systems negative except as above The patient's Past Medical, Family and Social History were reviewed and are on file in the EMR.  Objective:  Med list reviewed  Current Outpatient Medications:  .  dicyclomine (BENTYL) 10 MG capsule, Take 1 capsule (10 mg total) by mouth 3 (three) times daily as needed (abdominal pain)., Disp: 30 capsule, Rfl: 0 .  famotidine (PEPCID) 20 MG tablet, Take 1 tablet (20 mg total) by mouth daily., Disp: 30 tablet, Rfl: 1  Current Facility-Administered Medications:  .  0.9 %   sodium chloride infusion, 500 mL, Intravenous, Continuous, Danis, Starr Lake III, MD   Vital signs in last 24 hrs: Vitals:   12/14/20 1455  BP: 112/74  Pulse: 68    Physical Exam  He is well-appearing  HEENT: sclera anicteric, oral mucosa moist without lesions  Neck: supple, no thyromegaly, JVD or lymphadenopathy  Cardiac: RRR without murmurs, S1S2 heard, no peripheral edema  Pulm: clear to auscultation bilaterally, normal RR and effort noted  Abdomen: soft, no tenderness, with active bowel sounds. No guarding or palpable hepatosplenomegaly.  Skin; warm and dry, no jaundice or rash DRE normal perianal.  No tenderness, fissure or palpable internal lesion. Labs:  CBC Latest Ref Rng & Units 10/22/2020 04/20/2017 06/29/2016  WBC 4.0 - 10.5 K/uL 9.7 9.1 8.4  Hemoglobin 13.0 - 17.0 g/dL 28.3 66.2 94.7  Hematocrit 39.0 - 52.0 % 45.8 48.3 46.8  Platelets 150.0 - 400.0 K/uL 239.0 208 239   CMP Latest Ref Rng & Units 10/22/2020 04/20/2017 06/29/2016  Glucose 70 - 99 mg/dL 81 91 84  BUN 6 - 23 mg/dL 7 11 8   Creatinine 0.40 - 1.50 mg/dL 6.54 6.50  Sodium 135 - 145 mEq/L 138 134(L) 138  Potassium 3.5 - 5.1 mEq/L 3.9 3.8 3.4(L)  Chloride 96 - 112 mEq/L 102 102 104  CO2 19 - 32 mEq/L 29 21(L) 28  Calcium 8.4 - 10.5 mg/dL 9.6 9.3 9.9  Total Protein 6.0 - 8.3 g/dL 7.9 8.0 7.6  Total Bilirubin 0.2 -  1.2 mg/dL 0.4 0.6 0.3  Alkaline Phos 39 - 117 U/L 77 71 88  AST 0 - 37 U/L 20 26 37  ALT 0 - 53 U/L 25 23 51   CRP < 1.0  ___________________________________________ Radiologic studies:   ____________________________________________ Other:   _____________________________________________ Assessment & Plan  Assessment: Encounter Diagnoses  Name Primary?  . Periumbilical pain Yes  . Chronic constipation   . Rectal bleeding   . Abdominal bloating   . Gastroesophageal reflux disease without esophagitis    Longstanding multiple digestive symptoms with GERD but more so burning mid  to lower abdominal pain and constipation with intermittent rectal bleeding.  It was a long visit today reviewing his previous results, fielding multiple questions along with need for Spanish interpreter and discussing his diagnosis.  I believe he has a functional bowel disorder/IBS and I have tried to reassure him with the findings on work-up over the years.  Recent lab work also normal.  I think this is the same problem that he has been dealing with for years. 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  I recommended he see a urologist for the urinary retention. That said, given his symptoms and concerns, I offered to do another colonoscopy but he ultimately elected not to do so.  Plan: MiraLAX every day to every other day Trial of IBgard 2 capsules a day, samples given peer Stop dicyclomine Continue Pepcid We will be glad to see him as needed.  Forty-five minutes were spent on this encounter (including chart review, history/exam, counseling/coordination of care, and documentation)  Charlie Pitter III

## 2020-12-14 NOTE — Patient Instructions (Signed)
If you are age 34 or older, your body mass index should be between 23-30. Your Body mass index is 28.66 kg/m. If this is out of the aforementioned range listed, please consider follow up with your Primary Care Provider.  If you are age 96 or younger, your body mass index should be between 19-25. Your Body mass index is 28.66 kg/m. If this is out of the aformentioned range listed, please consider follow up with your Primary Care Provider.   We have given you samples of the following medication to take: IBgard: take 2 capsules daily.  Thank you for entrusting me with your care and choosing [facility].  Dr Myrtie Neither

## 2022-04-07 ENCOUNTER — Encounter: Payer: Self-pay | Admitting: Gastroenterology

## 2022-04-07 ENCOUNTER — Ambulatory Visit (INDEPENDENT_AMBULATORY_CARE_PROVIDER_SITE_OTHER): Payer: Self-pay | Admitting: Gastroenterology

## 2022-04-07 VITALS — BP 120/80 | HR 60 | Ht 67.25 in | Wt 187.4 lb

## 2022-04-07 DIAGNOSIS — R1033 Periumbilical pain: Secondary | ICD-10-CM

## 2022-04-07 DIAGNOSIS — R140 Abdominal distension (gaseous): Secondary | ICD-10-CM

## 2022-04-07 MED ORDER — METRONIDAZOLE 250 MG PO TABS: 250.0000 mg | ORAL_TABLET | Freq: Three times a day (TID) | ORAL | 0 refills | Status: AC

## 2022-04-07 NOTE — Patient Instructions (Addendum)
__We have sent the following medications to your pharmacy for you to pick up at your convenience: ?Flagyl 250 mg three times daily x 10 days ?_____________________________________________________ ? ?Food Guidelines for those with chronic digestive trouble: ? ?Many people have difficulty digesting certain foods, causing a variety of distressing and embarrassing symptoms such as abdominal pain, bloating and gas.  These foods may need to be avoided or consumed in small amounts.  Here are some tips that might be helpful for you. ? ?1.   Lactose intolerance is the difficulty or complete inability to digest lactose, the natural sugar in milk and anything made from milk.  This condition is harmless, common, and can begin any time during life.  Some people can digest a modest amount of lactose while others cannot tolerate any.  Also, not all dairy products contain equal amounts of lactose.  For example, hard cheeses such as parmesan have less lactose than soft cheeses such as cheddar.  Yogurt has less lactose than milk or cheese.  Many packaged foods (even many brands of bread) have milk, so read ingredient lists carefully.  It is difficult to test for lactose intolerance, so just try avoiding lactose as much as possible for a week and see what happens with your symptoms.  If you seem to be lactose intolerant, the best plan is to avoid it (but make sure you get calcium from another source).  The next best thing is to use lactase enzyme supplements, available over the counter everywhere.  Just know that many lactose intolerant people need to take several tablets with each serving of dairy to avoid symptoms.  Lastly, a lot of restaurant food is made with milk or butter.  Many are things you might not suspect, such as mashed potatoes, rice and pasta (cooked with butter) and "grilled" items.  If you are lactose intolerant, it never hurts to ask your server what has milk or butter. ? ?2.   Fiber is an important part of your  diet, but not all fiber is well-tolerated.  Insoluble fiber such as bran is often consumed by normal gut bacteria and converted into gas.  Soluble fiber such as oats, squash, carrots and green beans are typically tolerated better. ? ?3.   Some types of carbohydrates can be poorly digested.  Examples include: fructose (apples, cherries, pears, raisins and other dried fruits), fructans (onions, zucchini, large amounts of wheat), sorbitol/mannitol/xylitol and sucralose/Splenda (common artificial sweeteners), and raffinose (lentils, broccoli, cabbage, asparagus, brussel sprouts, many types of beans).  ?Do a Programmer, multimedia for FODMAP diet and you will find helpful information. ?Beano, a dietary supplement, will often help with raffinose-containing foods.  As with lactase tablets, you may need several per serving. ? ?4.   Whenever possible, avoid processed food&meats and chemical additives.  High fructose corn syrup, a common sweetener, may be difficult to digest.  Eggs and soy (comes from the soybean, and added to many foods now) are other common bloating/gassy foods. ? ?5.  Regarding gluten:  gluten is a protein mainly found in wheat, but also rye and barley.  There is a condition called celiac sprue, which is an inflammatory reaction in the small intestine causing a variety of digestive symptoms.  Blood testing is highly reliable to look for this condition, and sometimes upper endoscopy with small bowel biopsies may be necessary to make the diagnosis.  Many patients who test negative for celiac sprue report improvement in their digestive symptoms when they switch to a gluten-free diet.  However, in these "non-celiac gluten sensitive" patients, the true role of gluten in their symptoms is unclear.  Reducing carbohydrates in general may decrease the gas and bloating caused when gut bacteria consume carbs. Also, some of these patients may actually be intolerant of the baker's yeast in bread products rather than the gluten.   Flatbread and other reduced yeast breads might therefore be tolerated.  There is no specific testing available for most food intolerances, which are discovered mainly by dietary elimination.  Please do not embark on a gluten free diet unless directed by your doctor, as it is highly restrictive, and may lead to nutritional deficiencies if not carefully monitored.  Lastly, beware of internet claims offering "personalized" tests for food intolerances.  Such testing has no reliable scientific evidence to support its reliability and correlation to symptoms.   ? ?6.  The best advice is old advice, especially for those with chronic digestive trouble - try to eat "clean".  Balanced diet, avoid processed food, plenty of fruits and vegetables, cut down the sugar, minimal alcohol, avoid tobacco. ?Make time to care for yourself, get enough sleep, exercise when you can, reduce stress.  Your guts will thank you for it. ? ? ?- Dr. Amada Jupiter ?[facility] ? ?________________________________________________________ ?If you are age 26 or older, your body mass index should be between 23-30. Your Body mass index is 29.13 kg/m?Marland Kitchen If this is out of the aforementioned range listed, please consider follow up with your Primary Care Provider. ? ?If you are age 54 or younger, your body mass index should be between 19-25. Your Body mass index is 29.13 kg/m?Marland Kitchen If this is out of the aformentioned range listed, please consider follow up with your Primary Care Provider.  ? ?________________________________________________________ ? ?[facility] providers would like to encourage you to use [facility] to communicate with providers for non-urgent requests or questions.  Due to long hold times on the telephone, sending your provider a message by [facility] may be a faster and more efficient way to get a response.  Please allow 48 business hours for a response.  Please remember that this is for non-urgent requests.   ?_______________________________________________________ ? ?Due to recent changes in healthcare laws, you may see the results of your imaging and laboratory studies on MyChart before your provider has had a chance to review them.  We understand that in some cases there may be results that are confusing or concerning to you. Not all laboratory results come back in the same time frame and the provider may be waiting for multiple results in order to interpret others.  Please give [place] 48 hours in order for your provider to thoroughly review all the results before contacting the office for clarification of your results.  ? ? ? ? ? ?

## 2022-04-07 NOTE — Progress Notes (Signed)
? ? ? ?[facility] Progress Note ? ?Chief Complaint: Periumbilical abdominal pain and bloating ? ?Subjective  ?History: ?From my December 2021 office note: ?"Last seen by our NP on 10/22/2020 with chronic periumbilic to epigastric burning abdominal discomfort.  He has been seen for this as well as constipation with benign anal bleeding for least 5 years, originally by Dr. Arlyce Dice, then by me in 2017 he was then seen by a GI provider in [facility] in 2020.  Over the years he has had multiple endoscopic procedures and imaging studies as well as lab work and trials of different medicines. ?  ?I saw Albert Welch today along with his wife and a Spanish interpreter.  He has periumbilical to lower abdominal burning discomfort that he feels is similar to what occurred in the past but then it got better and returned a couple of months ago.  He has intermittent constipation and takes MiraLAX on average twice a week.  He has had some intermittent rectal bleeding and felt that there was a bulging or knot in the perianal area.  Dicyclomine did not help his abdominal pain.  Pepcid helps decreasing heartburn but he still has regurgitation.  He is concerned that something serious is going on with the symptoms.  I reviewed his extensive previous work-up, he knows that testing all turned out okay but is concerned that he could still develop cancer in the meantime." ? 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 ?___________________________________ ? ?Albert Welch was here with his wife and a Spanish interpreter for the entire visit. ?At some point after I last saw him he got to feeling well and says that symptoms resolved.  Then about 2 weeks ago he was having bloating with everything he ate, and this caused discomfort and concern.  He then started drinking some aloe vera tea and symptoms are slowly subsiding.  He is again concerned that he may have cancer. ?He denies rectal bleeding, nausea or vomiting.  He has periodic  regurgitation if he eats too much or drinks coffee. ? ? ?ROS: ?Cardiovascular:  no chest pain ?Respiratory: no dyspnea ? ?The patient's Past Medical, Family and Social History were reviewed and are on file in the EMR. ? ?Objective: ? ?Med list reviewed ? ?Current Outpatient Medications:  ?  metroNIDAZOLE (FLAGYL) 250 MG tablet, Take 1 tablet (250 mg total) by mouth 3 (three) times daily., Disp: 30 tablet, Rfl: 0 ? ?Current Facility-Administered Medications:  ?  0.9 %  sodium chloride infusion, 500 mL, Intravenous, Continuous, Danis, Andreas Blower, MD ? ? ?Vital signs in last 24 hrs: ?Vitals:  ? 04/07/22 1426  ?BP: 120/80  ?Pulse: 60  ? ?Wt Readings from Last 3 Encounters:  ?04/07/22 187 lb 6.4 oz (85 kg)  ?12/14/20 183 lb (83 kg)  ?10/22/20 185 lb (83.9 kg)  ?  ?Physical Exam ? ?He is well-appearing ?HEENT: sclera anicteric, oral mucosa moist without lesions ?Neck: supple, no thyromegaly, JVD or lymphadenopathy ?Cardiac: RRR without murmurs, S1S2 heard, no peripheral edema ?Pulm: clear to auscultation bilaterally, normal RR and effort noted ?Abdomen: soft, no tenderness, with active bowel sounds. No guarding or palpable hepatosplenomegaly. ?Skin; warm and dry, no jaundice or rash ? ?Labs: ? ? ?___________________________________________ ?Radiologic studies: ? ? ?____________________________________________ ?Other: ? ? ?_____________________________________________ ?Assessment & Plan  ?Assessment: ?Encounter Diagnoses  ?Name Primary?  ? Abdominal bloating Yes  ? Periumbilical abdominal pain   ? ? ?Longstanding intermittent abdominal pain and bloating, functional in nature.  Recent flare of the symptoms  for unclear reasons. ?I do not think further testing is likely to be revealing for this.  I have done my best to reassure him again.  He had not improved with prior trial of dicyclomine and says that IBGard made him feel worse. ?Although he does not have diarrhea, even when he was having these worsening symptoms weeks  ago, there is the possibility of SIBO.  I offered him a trial of metronidazole and he was agreeable.  After that, I do not feel have further treatments likely to help him. ?Reflux diet and lifestyle measures also reviewed. ? ?22 minutes were spent on this encounter (including chart review, history/exam, counseling/coordination of care, and documentation) > 50% of that time was spent on counseling and coordination of care.  ? ?Albert Welch ? ?

## 2023-12-11 ENCOUNTER — Ambulatory Visit: Payer: Self-pay | Admitting: Nurse Practitioner

## 2024-02-21 ENCOUNTER — Ambulatory Visit: Payer: Self-pay | Admitting: Physician Assistant

## 2024-02-21 NOTE — Progress Notes (Deleted)
 02/21/2024 Albert Welch [id number] [birth year]  Referring provider: No ref. provider found Primary GI doctor: Dr. Myrtie Neither  ASSESSMENT AND PLAN:   Assessment and Plan              Patient Care Team: Pcp, No as PCP - General Tommie Sams, DO (Pediatrics)  HISTORY OF PRESENT ILLNESS: 38 y.o. Spanish-speaking male with a past medical history of GERD, allergies, constipation, anxiety and others listed below presents for evaluation of abdominal discomfort.   Previously seen by Dr. Myrtie Neither 04/07/2022 for periumbilical abdominal pain and bloating.  For which she has had extensive endoscopic evaluations over several years, presumed to be functional.  Given trial of Flagyl 250 mg 3 times daily for 10 days and food guidelines for chronic digestive issues.  Discussed the use of AI scribe software for clinical note transcription with the patient, who gave verbal consent to proceed.  History of Present Illness             He  reports that he quit smoking about 13 years ago. His smoking use included cigarettes. He has never used smokeless tobacco. He reports current alcohol use. He reports that he does not use drugs.  RELEVANT GI HISTORY, LABS, IMAGING: Dr. Arlyce Dice in 2015. He underwent an EGD which was positive for H. pylori gastritis and he was treated with a Prevpac and his symptoms improved.   Evaluated by Dr. Myrtie Neither in 2017 due to having left lower quadrant abdominal pain, constipation and rectal bleeding. He underwent a colonoscopy 09/04/2016 which was normal.   He underwent an EGD 04/29/2015 at [facility], biopsies were negative for H. pylori.  He was last seen by Dr. Quinn Axe 06/03/2019 further evaluation regarding pain around his bellybutton area which moves up into his epigastrium which is the same pain he is currently experiencing.  He underwent H. pylori stool antigen testing 08/07/2019 which was negative.    CBC    Component Value Date/Time   WBC 9.7 10/22/2020  1642   RBC 5.37 10/22/2020 1642   HGB 15.5 10/22/2020 1642   HCT 45.8 10/22/2020 1642   PLT 239.0 10/22/2020 1642   MCV 85.2 10/22/2020 1642   MCH 29.9 04/20/2017 1549   MCHC 33.9 10/22/2020 1642   RDW 13.8 10/22/2020 1642   LYMPHSABS 3.0 10/22/2020 1642   MONOABS 0.7 10/22/2020 1642   EOSABS 0.3 10/22/2020 1642   BASOSABS 0.1 10/22/2020 1642   No results for input(s): "HGB" in the last 8760 hours.  CMP     Component Value Date/Time   NA 138 10/22/2020 1642   K 3.9 10/22/2020 1642   CL 102 10/22/2020 1642   CO2 29 10/22/2020 1642   GLUCOSE 81 10/22/2020 1642   BUN 7 10/22/2020 1642   CREATININE 1.02 10/22/2020 1642   CALCIUM 9.6 10/22/2020 1642   PROT 7.9 10/22/2020 1642   ALBUMIN 4.7 10/22/2020 1642   AST 20 10/22/2020 1642   ALT 25 10/22/2020 1642   ALKPHOS 77 10/22/2020 1642   BILITOT 0.4 10/22/2020 1642   GFRNONAA >60 04/20/2017 1549   GFRAA >60 04/20/2017 1549      Latest Ref Rng & Units 10/22/2020    4:42 PM 04/20/2017    3:49 PM 06/29/2016    9:47 PM  Hepatic Function  Total Protein 6.0 - 8.3 g/dL 7.9  8.0  7.6   Albumin 3.5 - 5.2 g/dL 4.7  4.8  4.6   AST 0 - 37 U/L 20  26  37   ALT 0 - 53 U/L 25  23  51   Alk Phosphatase 39 - 117 U/L 77  71  88   Total Bilirubin 0.2 - 1.2 mg/dL 0.4  0.6  0.3       Current Medications:             Current Outpatient Medications (Other):    metroNIDAZOLE (FLAGYL) 250 MG tablet, Take 1 tablet (250 mg total) by mouth 3 (three) times daily.  Current Facility-Administered Medications (Other):    0.9 %  sodium chloride infusion  Medical History:  Past Medical History:  Diagnosis Date   Allergy    GERD (gastroesophageal reflux disease)    H. pylori infection    Seasonal allergies    Allergies: No Known Allergies   Surgical History:  He  has a past surgical history that includes none. Family History:  His family history includes Diabetes in his mother; Kidney disease in his mother.  REVIEW OF SYSTEMS  :  All other systems reviewed and negative except where noted in the History of Present Illness.  PHYSICAL EXAM: There were no vitals taken for this visit. General Appearance: Well nourished, in no apparent distress. Head:   Normocephalic and atraumatic. Eyes:  sclerae anicteric,conjunctive pink  Respiratory: Respiratory effort normal, BS equal bilaterally without rales, rhonchi, wheezing. Cardio: RRR with no MRGs. Peripheral pulses intact.  Abdomen: Soft,  {BlankSingle:19197::"Flat","Obese","Non-distended"} ,active bowel sounds. {actendernessAB:27319} tenderness {anatomy; site abdomen:5010}. {BlankMultiple:19196::"Without guarding","With guarding","Without rebound","With rebound"}. No masses. Rectal: {acrectalexam:27461} Musculoskeletal: Full ROM, {PSY - GAIT AND STATION:22860} gait. {With/Without:304960234} edema. Skin:  Dry and intact without significant lesions or rashes Neuro: Alert and  oriented x4;  No focal deficits. Psych:  Cooperative. Normal mood and affect.    Doree Albee, PA-C 8:03 AM

## 2024-12-15 NOTE — Progress Notes (Deleted)
 "  Chief Complaint: Abdominal pain  HPI:    Albert Welch is a 38 year old Hispanic speaking male, known to Dr. Legrand, with a past medical history as listed below including GERD, who presents to clinic today for abdominal pain.    10/22/2020 office visit with Elida Shawl with chronic periumbilic to epigastric burning abdominal discomfort.  He has been seen for this as well as constipation with benign anal bleeding for least 5 years, originally by Dr. Debrah, then by me in 2017 he was then seen by a GI provider in [facility] in 2020.  Over the years he has had multiple endoscopic procedures and imaging studies as well as lab work and trials of different medicines.     04/07/22 patient seen in clinic by Dr. Legrand for periumbilical abdominal pain and bloating.  At that time discussed intermittent constipation.  On MiraLAX  twice a week.  Rectal bleeding.  Dicyclomine  did not help.  Pepcid  helped with heartburn.  At time discussed patient had longstanding intermittent abdominal pain and bloating which is functional in nature.  There is a recent flare of symptoms for unclear reasons.  Did not think further testing was necessary.  No improvement with Dicyclomine  and IBgard made him feel worse.  Patient treated with Metronidazole  for possibility of SIBO.  Then discussed that after that there were no further treatments likely to help him.  Past Medical History:  Diagnosis Date   Allergy    GERD (gastroesophageal reflux disease)    H. pylori infection    Seasonal allergies     Past Surgical History:  Procedure Laterality Date   none      Current Outpatient Medications  Medication Sig Dispense Refill   metroNIDAZOLE  (FLAGYL ) 250 MG tablet Take 1 tablet (250 mg total) by mouth 3 (three) times daily. 30 tablet 0   Current Facility-Administered Medications  Medication Dose Route Frequency Provider Last Rate Last Admin   0.9 %  sodium chloride  infusion  500 mL Intravenous Continuous Legrand Victory CROME  III, MD        Allergies as of 12/17/2024   (No Known Allergies)    Family History  Problem Relation Age of Onset   Diabetes Mother    Kidney disease Mother        as a child   Colon cancer Neg Hx    Esophageal cancer Neg Hx    Rectal cancer Neg Hx    Stomach cancer Neg Hx     Social History   Socioeconomic History   Marital status: Married    Spouse name: Not on file   Number of children: 2   Years of education: Not on file   Highest education level: Not on file  Occupational History   Not on file  Tobacco Use   Smoking status: Former    Current packs/day: 0.00    Types: Cigarettes    Quit date: 05/25/2010    Years since quitting: 14.5   Smokeless tobacco: Never  Vaping Use   Vaping status: Never Used  Substance and Sexual Activity   Alcohol use: Yes    Comment: rare   Drug use: No   Sexual activity: Not on file  Other Topics Concern   Not on file  Social History Narrative   ** Merged History Encounter **       Social Drivers of Health   Tobacco Use: Low Risk (08/31/2023)   Received from [facility]   Patient History    Smoking Tobacco  Use: Never    Smokeless Tobacco Use: Never    Passive Exposure: Not on file  Financial Resource Strain: Not on file  Food Insecurity: Not on file  Transportation Needs: Not on file  Physical Activity: Not on file  Stress: Not on file  Social Connections: Unknown (05/08/2022)   Received from [facility]   Social Network    Social Network: Not on file  Intimate Partner Violence: Unknown (03/30/2022)   Received from [facility]   HITS    Physically Hurt: Not on file    Insult or Talk Down To: Not on file    Threaten Physical Harm: Not on file    Scream or Curse: Not on file  Depression (PHQ2-9): Not on file  Alcohol Screen: Not on file  Housing: Not on file  Utilities: Not on file  Health Literacy: Not on file    Review of Systems:    Constitutional: No weight loss, fever, chills, weakness or fatigue HEENT:  Eyes: No change in vision               Ears, Nose, Throat:  No change in hearing or congestion Skin: No rash or itching Cardiovascular: No chest pain, chest pressure or palpitations   Respiratory: No SOB or cough Gastrointestinal: See HPI and otherwise negative Genitourinary: No dysuria or change in urinary frequency Neurological: No headache, dizziness or syncope Musculoskeletal: No new muscle or joint pain Hematologic: No bleeding or bruising Psychiatric: No history of depression or anxiety    Physical Exam:  Vital signs: There were no vitals taken for this visit.  Constitutional:   Pleasant Caucasian male appears to be in NAD, Well developed, Well nourished, alert and cooperative Head:  Normocephalic and atraumatic. Eyes:   PEERL, EOMI. No icterus. Conjunctiva pink. Ears:  Normal auditory acuity. Neck:  Supple Throat: Oral cavity and pharynx without inflammation, swelling or lesion.  Respiratory: Respirations even and unlabored. Lungs clear to auscultation bilaterally.   No wheezes, crackles, or rhonchi.  Cardiovascular: Normal S1, S2. No MRG. Regular rate and rhythm. No peripheral edema, cyanosis or pallor.  Gastrointestinal:  Soft, nondistended, nontender. No rebound or guarding. Normal bowel sounds. No appreciable masses or hepatomegaly. Rectal:  Not performed.  Msk:  Symmetrical without gross deformities. Without edema, no deformity or joint abnormality.  Neurologic:  Alert and  oriented x4;  grossly normal neurologically.  Skin:   Dry and intact without significant lesions or rashes. Psychiatric: Oriented to person, place and time. Demonstrates good judgement and reason without abnormal affect or behaviors.  RELEVANT LABS AND IMAGING: CBC    Component Value Date/Time   WBC 9.7 10/22/2020 1642   RBC 5.37 10/22/2020 1642   HGB 15.5 10/22/2020 1642   HCT 45.8 10/22/2020 1642   PLT 239.0 10/22/2020 1642   MCV 85.2 10/22/2020 1642   MCH 29.9 04/20/2017 1549   MCHC  33.9 10/22/2020 1642   RDW 13.8 10/22/2020 1642   LYMPHSABS 3.0 10/22/2020 1642   MONOABS 0.7 10/22/2020 1642   EOSABS 0.3 10/22/2020 1642   BASOSABS 0.1 10/22/2020 1642    CMP     Component Value Date/Time   NA 138 10/22/2020 1642   K 3.9 10/22/2020 1642   CL 102 10/22/2020 1642   CO2 29 10/22/2020 1642   GLUCOSE 81 10/22/2020 1642   BUN 7 10/22/2020 1642   CREATININE 1.02 10/22/2020 1642   CALCIUM 9.6 10/22/2020 1642   PROT 7.9 10/22/2020 1642   ALBUMIN 4.7 10/22/2020 1642  AST 20 10/22/2020 1642   ALT 25 10/22/2020 1642   ALKPHOS 77 10/22/2020 1642   BILITOT 0.4 10/22/2020 1642   GFRNONAA >60 04/20/2017 1549   GFRAA >60 04/20/2017 1549    Assessment: 1. ***  Plan: 1. ***     Delon Failing, PA-C [facility] 12/15/2024, 11:53 AM  Cc: No ref. provider found  "

## 2024-12-17 ENCOUNTER — Ambulatory Visit: Payer: Self-pay | Admitting: Physician Assistant
